# Patient Record
Sex: Male | Born: 1960 | Race: Black or African American | Hispanic: No | Marital: Married | Smoking: Former smoker
Health system: Southern US, Community
[De-identification: ages and names within clinical notes are randomized; demographics above are authoritative.]

## PROBLEM LIST (undated history)

## (undated) DIAGNOSIS — E559 Vitamin D deficiency, unspecified: Secondary | ICD-10-CM

## (undated) DIAGNOSIS — J45909 Unspecified asthma, uncomplicated: Secondary | ICD-10-CM

## (undated) DIAGNOSIS — I10 Essential (primary) hypertension: Secondary | ICD-10-CM

## (undated) DIAGNOSIS — R569 Unspecified convulsions: Secondary | ICD-10-CM

## (undated) DIAGNOSIS — G43909 Migraine, unspecified, not intractable, without status migrainosus: Secondary | ICD-10-CM

## (undated) DIAGNOSIS — C61 Malignant neoplasm of prostate: Secondary | ICD-10-CM

## (undated) DIAGNOSIS — M549 Dorsalgia, unspecified: Secondary | ICD-10-CM

## (undated) DIAGNOSIS — G4730 Sleep apnea, unspecified: Secondary | ICD-10-CM

## (undated) DIAGNOSIS — I209 Angina pectoris, unspecified: Secondary | ICD-10-CM

## (undated) DIAGNOSIS — K219 Gastro-esophageal reflux disease without esophagitis: Secondary | ICD-10-CM

## (undated) DIAGNOSIS — F4310 Post-traumatic stress disorder, unspecified: Secondary | ICD-10-CM

## (undated) DIAGNOSIS — E119 Type 2 diabetes mellitus without complications: Secondary | ICD-10-CM

## (undated) DIAGNOSIS — I219 Acute myocardial infarction, unspecified: Secondary | ICD-10-CM

## (undated) DIAGNOSIS — G8929 Other chronic pain: Secondary | ICD-10-CM

## (undated) HISTORY — PX: FRACTURE SURGERY: SHX138

## (undated) HISTORY — DX: Migraine, unspecified, not intractable, without status migrainosus: G43.909

## (undated) HISTORY — DX: Acute myocardial infarction, unspecified: I21.9

## (undated) HISTORY — DX: Vitamin D deficiency, unspecified: E55.9

## (undated) HISTORY — PX: HERNIA REPAIR: SHX51

---
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
## 1981-10-03 HISTORY — PX: TONGUE SURGERY: SHX810

## 2010-10-03 HISTORY — PX: LUMBAR DISC SURGERY: SHX700

## 2010-10-03 HISTORY — PX: BACK SURGERY: SHX140

## 2014-10-03 HISTORY — PX: FINGER FRACTURE SURGERY: SHX638

## 2015-10-04 DIAGNOSIS — C61 Malignant neoplasm of prostate: Secondary | ICD-10-CM

## 2015-10-04 HISTORY — DX: Malignant neoplasm of prostate: C61

## 2015-10-04 HISTORY — PX: PROSTATE BIOPSY: SHX241

## 2016-05-03 HISTORY — PX: PROSTATECTOMY: SHX69

## 2016-05-03 HISTORY — PX: ABDOMINAL HERNIA REPAIR: SHX539

## 2016-09-02 HISTORY — PX: CARDIAC CATHETERIZATION: SHX172

## 2017-01-13 ENCOUNTER — Emergency Department (HOSPITAL_COMMUNITY)
Admission: EM | Admit: 2017-01-13 | Discharge: 2017-01-13 | Disposition: A | Discharge status: Home / Self Care | Payer: Medicaid Other | Attending: Emergency Medicine | Admitting: Emergency Medicine

## 2017-01-13 ENCOUNTER — Encounter (HOSPITAL_COMMUNITY): Payer: Self-pay | Admitting: *Deleted

## 2017-01-13 DIAGNOSIS — Y929 Unspecified place or not applicable: Secondary | ICD-10-CM | POA: Insufficient documentation

## 2017-01-13 DIAGNOSIS — Y999 Unspecified external cause status: Secondary | ICD-10-CM | POA: Insufficient documentation

## 2017-01-13 DIAGNOSIS — Y939 Activity, unspecified: Secondary | ICD-10-CM | POA: Diagnosis not present

## 2017-01-13 DIAGNOSIS — M545 Low back pain, unspecified: Secondary | ICD-10-CM

## 2017-01-13 DIAGNOSIS — W1830XA Fall on same level, unspecified, initial encounter: Secondary | ICD-10-CM | POA: Insufficient documentation

## 2017-01-13 DIAGNOSIS — Z87891 Personal history of nicotine dependence: Secondary | ICD-10-CM | POA: Insufficient documentation

## 2017-01-13 DIAGNOSIS — Z859 Personal history of malignant neoplasm, unspecified: Secondary | ICD-10-CM | POA: Diagnosis not present

## 2017-01-13 HISTORY — DX: Other chronic pain: G89.29

## 2017-01-13 MED ORDER — KETOROLAC TROMETHAMINE 30 MG/ML IJ SOLN
30.0000 mg | Freq: Once | INTRAMUSCULAR | Status: AC
  Administered 2017-01-13: 30 mg via INTRAMUSCULAR
  Filled 2017-01-13: qty 1

## 2017-01-13 MED ORDER — KETOROLAC TROMETHAMINE 30 MG/ML IJ SOLN: 30.0000 mg | Freq: Once | INTRAMUSCULAR | Status: DC

## 2017-01-13 MED ORDER — MELOXICAM 7.5 MG PO TABS: 7.5000 mg | ORAL_TABLET | Freq: Every day | ORAL | 0 refills | Status: DC

## 2017-01-13 NOTE — ED Notes (Signed)
Papers reviewed with patient and he verbalizes understanding.UTA effectiveness of pain medication. toradol given shortly before DC

## 2017-01-13 NOTE — ED Provider Notes (Signed)
[facility] DEPT Provider Note   CSN: [id number] Arrival date & time: 01/13/17  2831  By signing my name below, I, Evan Mcdaniel, attest that this documentation has been prepared under the direction and in the presence of Evan Nestor, PA-C. Electronically Signed: Norris Mcdaniel , ED Scribe. 01/13/17. 3:35 PM.   History   Chief Complaint Chief Complaint  Patient presents with  . Back Pain    HPI Evan Mcdaniel is a 56 y.o. male with hx of chronic back pain, prostatectomy, HTN who presents to the Emergency Department complaining of constant, mild to moderate back pain with sudden onset x12 hours s/p mechanical fall. Pt states that he had a hard fall last night where he slipped and fell onto the L sided buttock. Upon falling, he reportedly hit his head but denies any headache at the moment or concern for head injury. He reports the pain being localized to the L sided back and buttock. Pt reports back pain, cough, sneezing. He has tried 10 mg oxycodone with no relief and states that the pain is exacerbated with forward flexion and extension. Pt denies numbness/tingling in the lower extremities, chest pain, nausea, vomiting and fever. Pt reports known allergy to Vicodin. Of note, pt is a smoker. Pt states that he took his blood pressure medication this morning and notes his blood pressure is elevated. Pt states that he does not want any imaging done. Pt's chart states that he is allergic to Naproxen but pt takes other NSAIDs and is able to tolerate them.   The history is provided by the patient. No language interpreter was used.    Past Medical History:  Diagnosis Date  . Cancer ([place])   . Chronic pain     There are no active problems to display for this patient.   Past Surgical History:  Procedure Laterality Date  . PROSTATECTOMY         Home Medications    Prior to Admission medications   Medication Sig Start Date End Date Taking? Authorizing Provider  meloxicam (MOBIC)  7.5 MG tablet Take 1 tablet (7.5 mg total) by mouth daily. 01/13/17   Bonnie Roig, PA-C    Family History No family history on file.  Social History Social History  Substance Use Topics  . Smoking status: Former Smoker    Quit date: 10/03/2013  . Smokeless tobacco: Never Used  . Alcohol use No     Allergies   Naproxen and Vicodin [hydrocodone-acetaminophen]   Review of Systems Review of Systems  Constitutional: Negative for fever.  HENT: Positive for sneezing.   Respiratory: Positive for cough.   Cardiovascular: Negative for chest pain.  Gastrointestinal: Negative for nausea and vomiting.  Musculoskeletal: Positive for back pain.  Neurological: Negative for numbness.     Physical Exam Updated Vital Signs BP (!) 162/107 (BP Location: Right Arm)   Pulse 69   Temp 97.2 F (36.2 C) (Oral)   Resp 16   Ht 5\' 11"  (1.803 m)   Wt 93.5 kg   SpO2 99%   BMI 28.76 kg/m   Physical Exam  Constitutional: He appears well-developed and well-nourished.  HENT:  Head: Normocephalic and atraumatic.  Eyes: Conjunctivae are normal.  Neck: Neck supple.  Cardiovascular: Normal rate and regular rhythm.   No murmur heard. Pulmonary/Chest: Effort normal and breath sounds normal. No respiratory distress.  Abdominal: Soft. There is no tenderness.  Musculoskeletal: He exhibits no edema or deformity.       Lumbar back: He exhibits tenderness.  L sided paraspinal tenderness in the upper lumbar area with no bruising. Pt has painful ROM with forward flexion and extension but he is able to walk normally.   Neurological: He is alert. No sensory deficit. He exhibits normal muscle tone. Coordination normal.  Skin: Skin is warm and dry.  Psychiatric: He has a normal mood and affect.  Nursing note and vitals reviewed.    ED Treatments / Results   DIAGNOSTIC STUDIES: Oxygen Saturation is 99% on RA, normal by my interpretation.   COORDINATION OF CARE: 3:35 PM-Discussed next steps with pt.  Pt verbalized understanding and is agreeable with the plan.    Labs (all labs ordered are listed, but only abnormal results are displayed) Labs Reviewed - No data to display  EKG  EKG Interpretation None       Radiology No results found.  Procedures Procedures (including critical care time)  Medications Ordered in ED Medications  ketorolac (TORADOL) 30 MG/ML injection 30 mg (30 mg Intramuscular Given 01/13/17 1042)     Initial Impression / Assessment and Plan / ED Course  I have reviewed the triage vital signs and the nursing notes.  Pertinent labs & imaging results that were available during my care of the patient were reviewed by me and considered in my medical decision making (see chart for details).     History and symptoms concerning for muscle strain of lower back. Patient states he has "broken my back" before and this feelings nothing like that. Able to ambulate normally. Denies any bladder incontinence out of the ordinary for him. He refuses imaging at this time. He has no concern for head injury. Neurological exam is normal. States he would like anti-inflammatory to help with the pain and declined muscle relaxer. Inquired about narcotic pain medication but we declined. Because patient states he is able to tolerate anti-inflammatories other than naproxen, we'll start him on mobic once daily and encouraged to follow up with PCP.   Final Clinical Impressions(s) / ED Diagnoses   Final diagnoses:  Left-sided low back pain without sciatica, unspecified chronicity    New Prescriptions Discharge Medication List as of 01/13/2017 10:28 AM    START taking these medications   Details  meloxicam (MOBIC) 7.5 MG tablet Take 1 tablet (7.5 mg total) by mouth daily., Starting Fri 01/13/2017, Print       I personally performed the services described in this documentation, which was scribed in my presence. The recorded information has been reviewed and is accurate.     Evan Heady, PA-C 01/13/17 [place], MD 01/14/17 [phone]

## 2017-01-13 NOTE — ED Triage Notes (Signed)
Pt reports falling last night and hit head on L side, no facial injury noted, denies LOC, denies neck pain, pt ambulatory, MAE, pt denies taking blood thinners, pt reports mid upper back pain, denies bowel & bladder incontinence, A&O x4, pt takes radiation every other week for prostate cancer, recent prostatectomy in Dec 2017, denies fever & chills

## 2017-01-13 NOTE — Discharge Instructions (Signed)
Begin taking Mobic once daily for pain and inflammation. Follow up with PCP in 2-3 days if symptoms do not improve. Return to ED for increased pain, trouble walking, numbness, weakness, fever or additional injury.

## 2017-01-15 ENCOUNTER — Emergency Department (HOSPITAL_COMMUNITY): Payer: Medicaid Other

## 2017-01-15 ENCOUNTER — Emergency Department (HOSPITAL_COMMUNITY)
Admission: EM | Admit: 2017-01-15 | Discharge: 2017-01-15 | Disposition: A | Discharge status: Home / Self Care | Payer: Medicaid Other | Attending: Emergency Medicine | Admitting: Emergency Medicine

## 2017-01-15 DIAGNOSIS — Z7982 Long term (current) use of aspirin: Secondary | ICD-10-CM | POA: Diagnosis not present

## 2017-01-15 DIAGNOSIS — Z87891 Personal history of nicotine dependence: Secondary | ICD-10-CM | POA: Diagnosis not present

## 2017-01-15 DIAGNOSIS — Z794 Long term (current) use of insulin: Secondary | ICD-10-CM | POA: Insufficient documentation

## 2017-01-15 DIAGNOSIS — Z79899 Other long term (current) drug therapy: Secondary | ICD-10-CM | POA: Insufficient documentation

## 2017-01-15 DIAGNOSIS — R079 Chest pain, unspecified: Secondary | ICD-10-CM | POA: Insufficient documentation

## 2017-01-15 DIAGNOSIS — Z9114 Patient's other noncompliance with medication regimen: Secondary | ICD-10-CM | POA: Insufficient documentation

## 2017-01-15 DIAGNOSIS — Z859 Personal history of malignant neoplasm, unspecified: Secondary | ICD-10-CM | POA: Diagnosis not present

## 2017-01-15 LAB — CBC
HCT: 42.6 % (ref 39.0–52.0)
HEMOGLOBIN: 13.8 g/dL (ref 13.0–17.0)
MCH: 28.2 pg (ref 26.0–34.0)
MCHC: 32.4 g/dL (ref 30.0–36.0)
MCV: 86.9 fL (ref 78.0–100.0)
Platelets: 181 10*3/uL (ref 150–400)
RBC: 4.9 MIL/uL (ref 4.22–5.81)
RDW: 14.3 % (ref 11.5–15.5)
WBC: 5.5 10*3/uL (ref 4.0–10.5)

## 2017-01-15 LAB — BASIC METABOLIC PANEL
ANION GAP: 14 (ref 5–15)
BUN: 10 mg/dL (ref 6–20)
CHLORIDE: 99 mmol/L — AB (ref 101–111)
CO2: 24 mmol/L (ref 22–32)
Calcium: 9.8 mg/dL (ref 8.9–10.3)
Creatinine, Ser: 0.85 mg/dL (ref 0.61–1.24)
GFR calc Af Amer: >60 mL/min (ref >60)
GFR calc non Af Amer: >60 mL/min (ref >60)
Glucose, Bld: 169 mg/dL — ABNORMAL HIGH (ref 65–99)
POTASSIUM: 4.1 mmol/L (ref 3.5–5.1)
SODIUM: 137 mmol/L (ref 135–145)

## 2017-01-15 LAB — I-STAT TROPONIN, ED: Troponin i, poc: 0 ng/mL (ref 0.00–0.08)

## 2017-01-15 LAB — PHENYTOIN LEVEL, TOTAL: Phenytoin Lvl: <2.5 ug/mL — ABNORMAL LOW (ref 10.0–20.0)

## 2017-01-15 MED ORDER — AMLODIPINE BESYLATE 10 MG PO TABS: 10.0000 mg | ORAL_TABLET | Freq: Every day | ORAL | 0 refills | Status: AC

## 2017-01-15 MED ORDER — LORAZEPAM 1 MG PO TABS
1.0000 mg | ORAL_TABLET | Freq: Once | ORAL | Status: AC
  Administered 2017-01-15: 1 mg via ORAL
  Filled 2017-01-15: qty 1

## 2017-01-15 MED ORDER — VENLAFAXINE HCL 75 MG PO TABS: 225.0000 mg | ORAL_TABLET | Freq: Every day | ORAL | 0 refills | Status: AC

## 2017-01-15 MED ORDER — METFORMIN HCL 1000 MG PO TABS: 1000.0000 mg | ORAL_TABLET | Freq: Two times a day (BID) | ORAL | 0 refills | Status: AC

## 2017-01-15 MED ORDER — HYDROCHLOROTHIAZIDE 25 MG PO TABS: 25.0000 mg | ORAL_TABLET | Freq: Every day | ORAL | 0 refills | Status: DC

## 2017-01-15 MED ORDER — ESCITALOPRAM OXALATE 10 MG PO TABS: 10.0000 mg | ORAL_TABLET | Freq: Every day | ORAL | Status: DC

## 2017-01-15 MED ORDER — LISINOPRIL 40 MG PO TABS: 40.0000 mg | ORAL_TABLET | Freq: Every day | ORAL | 0 refills | Status: DC

## 2017-01-15 MED ORDER — ACETAMINOPHEN 325 MG PO TABS
650.0000 mg | ORAL_TABLET | Freq: Once | ORAL | Status: AC
  Administered 2017-01-15: 650 mg via ORAL
  Filled 2017-01-15: qty 2

## 2017-01-15 MED ORDER — VENLAFAXINE HCL 75 MG PO TABS
75.0000 mg | ORAL_TABLET | Freq: Three times a day (TID) | ORAL | Status: DC
  Administered 2017-01-15: 75 mg via ORAL
  Filled 2017-01-15: qty 1

## 2017-01-15 NOTE — ED Notes (Signed)
Called SW but patient left before SW answered. Patient chose not to wait. Given bus pass. Patient leaving now.

## 2017-01-15 NOTE — ED Provider Notes (Signed)
[facility] DEPT Provider Note   CSN: [id number] Arrival date & time: 01/15/17  1102     History   Chief Complaint Chief Complaint  Patient presents with  . Chest Pain    HPI Evan Mcdaniel is a 56 y.o. male.  HPI This is a 56 year old man history of prostate cancer and chronic pain who presents today complaining of chest pain. He states that the chest pain began earlier today at approximately 9 AM. He complains of some associated sweating. He states that he had similar symptoms with an MI. He states that he has been out of his medications after losing them when he changed the hotel rooms. He is on narcotics and benzodiazepines ordered by a pain clinic. He describes pain as sharp and anterior worsening some with position change. He has not taken any medications for this but received nitroglycerin and route without any change. [facility] of [facility] reveal no evidence of cardiac history with a normal cardiac catheterization Past Medical History:  Diagnosis Date  . Cancer ([place])   . Chronic pain     There are no active problems to display for this patient.   Past Surgical History:  Procedure Laterality Date  . PROSTATECTOMY         Home Medications    Prior to Admission medications   Medication Sig Start Date End Date Taking? Authorizing Provider  albuterol (PROVENTIL HFA;VENTOLIN HFA) 108 (90 Base) MCG/ACT inhaler Inhale 2 puffs into the lungs every 6 (six) hours as needed for wheezing or shortness of breath.   Yes Historical Provider, MD  ALPRAZolam Duanne Moron) 1 MG tablet Take 1 mg by mouth 3 (three) times daily. 12/29/16  Yes Historical Provider, MD  amLODipine (NORVASC) 10 MG tablet Take 10 mg by mouth daily.   Yes Historical Provider, MD  aspirin 325 MG tablet Take 325 mg by mouth daily.   Yes Historical Provider, MD  escitalopram (LEXAPRO) 10 MG tablet Take 10 mg by mouth daily.   Yes Historical Provider, MD  famotidine (PEPCID) 10 MG tablet Take  10 mg by mouth daily. 12/01/16  Yes Historical Provider, MD  hydrochlorothiazide (HYDRODIURIL) 25 MG tablet Take 25 mg by mouth daily. 01/02/17  Yes Historical Provider, MD  insulin aspart (NOVOLOG) 100 UNIT/ML injection Inject 20 Units into the skin 2 (two) times daily.   Yes Historical Provider, MD  LANTUS SOLOSTAR 100 UNIT/ML Solostar Pen Inject 15 Units into the skin at bedtime. 12/01/16  Yes Historical Provider, MD  lisinopril (PRINIVIL,ZESTRIL) 40 MG tablet Take 40 mg by mouth daily. 01/02/17  Yes Historical Provider, MD  metFORMIN (GLUCOPHAGE) 1000 MG tablet Take 1,000 mg by mouth 2 (two) times daily with a meal.   Yes Historical Provider, MD  metoprolol succinate (TOPROL-XL) 100 MG 24 hr tablet Take 100 mg by mouth daily. Take with or immediately following a meal.   Yes Historical Provider, MD  nitroGLYCERIN (NITROSTAT) 0.4 MG SL tablet Place 0.4 mg under the tongue every 5 (five) minutes as needed for chest pain.   Yes Historical Provider, MD  oxyCODONE (OXYCONTIN) 10 mg 12 hr tablet Take 10 mg by mouth every 12 (twelve) hours.   Yes Historical Provider, MD  oxyCODONE (ROXICODONE) 15 MG immediate release tablet Take 15 mg by mouth 4 (four) times daily as needed. 01/11/17  Yes Historical Provider, MD  phenytoin (DILANTIN) 100 MG ER capsule Take 100 mg by mouth 3 (three) times daily.   Yes Historical Provider, MD  simvastatin (  ZOCOR) 10 MG tablet Take 10 mg by mouth daily at 6 PM.   Yes Historical Provider, MD  sitaGLIPtin (JANUVIA) 50 MG tablet Take 50 mg by mouth daily.   Yes Historical Provider, MD  venlafaxine (EFFEXOR) 75 MG tablet Take 225 mg by mouth daily. 10/14/16  Yes Historical Provider, MD  Vitamin D, Ergocalciferol, (DRISDOL) 50000 units CAPS capsule Take 50,000 Units by mouth every Monday.   Yes Historical Provider, MD  meloxicam (MOBIC) 7.5 MG tablet Take 1 tablet (7.5 mg total) by mouth daily. Patient not taking: Reported on 01/15/2017 01/13/17   [name], PA-C    Family History No  family history on file.  Social History Social History  Substance Use Topics  . Smoking status: Former Smoker    Quit date: 10/03/2013  . Smokeless tobacco: Never Used  . Alcohol use No     Allergies   Naproxen and Vicodin [hydrocodone-acetaminophen]   Review of Systems Review of Systems  All other systems reviewed and are negative.    Physical Exam Updated Vital Signs BP (!) 158/101 (BP Location: Right Arm)   Pulse 64   Temp 98.1 F (36.7 C) (Oral)   Resp 18   Ht 5\' 11"  (1.803 m)   Wt 93.4 kg   SpO2 100%   BMI 28.73 kg/m   Physical Exam  Constitutional: He is oriented to person, place, and time. He appears well-developed and well-nourished.  HENT:  Head: Normocephalic and atraumatic.  Right Ear: External ear normal.  Left Ear: External ear normal.  Eyes: Conjunctivae and EOM are normal. Pupils are equal, round, and reactive to light.  Neck: Normal range of motion. Neck supple.  Cardiovascular: Normal rate, regular rhythm and normal heart sounds.   Pulmonary/Chest: Effort normal and breath sounds normal.  Abdominal: Soft. He exhibits no distension.  Musculoskeletal: Normal range of motion.  Neurological: He is alert and oriented to person, place, and time.  Skin: Skin is warm and dry. Capillary refill takes less than 2 seconds.  Psychiatric: He has a normal mood and affect.  Nursing note and vitals reviewed.    ED Treatments / Results  Labs (all labs ordered are listed, but only abnormal results are displayed) Labs Reviewed  BASIC METABOLIC PANEL - Abnormal; Notable for the following:       Result Value   Chloride 99 (*)    Glucose, Bld 169 (*)    All other components within normal limits  PHENYTOIN LEVEL, TOTAL - Abnormal; Notable for the following:    Phenytoin Lvl <2.5 (*)    All other components within normal limits  CBC  I-STAT TROPOININ, ED    EKG  EKG Interpretation  Date/Time:  Sunday January 15 2017 11:15:12 EDT Ventricular Rate:  69 PR  Interval:    QRS Duration: 84 QT Interval:  402 QTC Calculation: 431 R Axis:   24 Text Interpretation:  Sinus rhythm Borderline low voltage, extremity leads Confirmed by Omya Winfield MD, Andee Poles ([id number]) on 01/15/2017 11:23:57 AM Also confirmed by Murrell Elizondo MD, Andee Poles [phone]), editor Drema Pry [phone])  on 01/15/2017 12:01:20 PM       Radiology Dg Chest 2 View  Result Date: 01/15/2017 CLINICAL DATA:  Chest pain. EXAM: CHEST  2 VIEW COMPARISON:  None. FINDINGS: Midline trachea. Normal heart size. Mildly tortuous thoracic aorta with atherosclerosis in the transverse segment. No pleural effusion or pneumothorax. Clear lungs. IMPRESSION: No acute cardiopulmonary disease. Aortic atherosclerosis. Electronically Signed   By: Abigail Miyamoto M.D.   On:  01/15/2017 13:23    Procedures Procedures (including critical care time)  Medications Ordered in ED Medications  venlafaxine [facility]) tablet 75 mg (not administered)  acetaminophen (TYLENOL) tablet 650 mg (not administered)  LORazepam (ATIVAN) tablet 1 mg (1 mg Oral Given 01/15/17 1156)     Initial Impression / Assessment and Plan / ED Course  I have reviewed the triage vital signs and the nursing notes.  Pertinent labs & imaging results that were available during my care of the patient were reviewed by me and considered in my medical decision making (see chart for details).     Patient with normal workup here. He requested his Effexor was given a dose of his Effexor here. He has no further complaints of chest pain. Chest x-Torri Langston, EKG, and troponin normal. We discussed his medications and I will refill his Effexor and blood pressure medications. He will follow-up with his pain medicine doctors.  Final Clinical Impressions(s) / ED Diagnoses   Final diagnoses:  Chest pain, unspecified type  Noncompliance with medications    New Prescriptions New Prescriptions   No medications on file     Pattricia Boss, MD 01/15/17 1425

## 2017-01-15 NOTE — ED Triage Notes (Signed)
Patient comes in per [facility] with chest pain. Started at [place] upon waking. Patient c/o sob. Lung sounds clear. Cardiac hx. EMS gave 324 mg aspirin. Patient took nitro x2 without relief and EMS gave nitro x1 without relief. IV access 18 in LAC.
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
## 2017-03-28 ENCOUNTER — Other Ambulatory Visit: Payer: Self-pay

## 2017-03-28 ENCOUNTER — Emergency Department (HOSPITAL_COMMUNITY): Payer: Medicaid Other

## 2017-03-28 ENCOUNTER — Observation Stay (HOSPITAL_COMMUNITY)
Admission: EM | Admit: 2017-03-28 | Discharge: 2017-03-30 | Disposition: A | Discharge status: Home / Self Care | Payer: Medicaid Other | Attending: Internal Medicine | Admitting: Internal Medicine

## 2017-03-28 ENCOUNTER — Encounter (HOSPITAL_COMMUNITY): Payer: Self-pay | Admitting: *Deleted

## 2017-03-28 DIAGNOSIS — Z7982 Long term (current) use of aspirin: Secondary | ICD-10-CM | POA: Diagnosis not present

## 2017-03-28 DIAGNOSIS — R0602 Shortness of breath: Secondary | ICD-10-CM | POA: Insufficient documentation

## 2017-03-28 DIAGNOSIS — E119 Type 2 diabetes mellitus without complications: Secondary | ICD-10-CM | POA: Diagnosis not present

## 2017-03-28 DIAGNOSIS — C799 Secondary malignant neoplasm of unspecified site: Secondary | ICD-10-CM

## 2017-03-28 DIAGNOSIS — F17210 Nicotine dependence, cigarettes, uncomplicated: Secondary | ICD-10-CM | POA: Insufficient documentation

## 2017-03-28 DIAGNOSIS — Z794 Long term (current) use of insulin: Secondary | ICD-10-CM | POA: Insufficient documentation

## 2017-03-28 DIAGNOSIS — Z8546 Personal history of malignant neoplasm of prostate: Secondary | ICD-10-CM | POA: Diagnosis not present

## 2017-03-28 DIAGNOSIS — R0789 Other chest pain: Secondary | ICD-10-CM | POA: Diagnosis not present

## 2017-03-28 DIAGNOSIS — R200 Anesthesia of skin: Secondary | ICD-10-CM | POA: Diagnosis not present

## 2017-03-28 DIAGNOSIS — R079 Chest pain, unspecified: Secondary | ICD-10-CM

## 2017-03-28 DIAGNOSIS — G8929 Other chronic pain: Secondary | ICD-10-CM | POA: Diagnosis present

## 2017-03-28 DIAGNOSIS — Z79899 Other long term (current) drug therapy: Secondary | ICD-10-CM | POA: Diagnosis not present

## 2017-03-28 DIAGNOSIS — I10 Essential (primary) hypertension: Secondary | ICD-10-CM | POA: Diagnosis present

## 2017-03-28 DIAGNOSIS — F1290 Cannabis use, unspecified, uncomplicated: Secondary | ICD-10-CM | POA: Diagnosis present

## 2017-03-28 DIAGNOSIS — I639 Cerebral infarction, unspecified: Secondary | ICD-10-CM

## 2017-03-28 DIAGNOSIS — C61 Malignant neoplasm of prostate: Secondary | ICD-10-CM | POA: Diagnosis present

## 2017-03-28 DIAGNOSIS — R531 Weakness: Secondary | ICD-10-CM | POA: Diagnosis not present

## 2017-03-28 HISTORY — DX: Essential (primary) hypertension: I10

## 2017-03-28 LAB — COMPREHENSIVE METABOLIC PANEL
ALK PHOS: 53 U/L (ref 38–126)
ALT: 16 U/L — ABNORMAL LOW (ref 17–63)
ANION GAP: 11 (ref 5–15)
AST: 19 U/L (ref 15–41)
Albumin: 4.1 g/dL (ref 3.5–5.0)
BILIRUBIN TOTAL: 0.6 mg/dL (ref 0.3–1.2)
BUN: 10 mg/dL (ref 6–20)
CALCIUM: 9.5 mg/dL (ref 8.9–10.3)
CO2: 24 mmol/L (ref 22–32)
Chloride: 104 mmol/L (ref 101–111)
Creatinine, Ser: 0.89 mg/dL (ref 0.61–1.24)
GFR calc non Af Amer: >60 mL/min (ref >60)
GLUCOSE: 115 mg/dL — AB (ref 65–99)
Potassium: 3.3 mmol/L — ABNORMAL LOW (ref 3.5–5.1)
Sodium: 139 mmol/L (ref 135–145)
TOTAL PROTEIN: 7 g/dL (ref 6.5–8.1)

## 2017-03-28 LAB — URINALYSIS, ROUTINE W REFLEX MICROSCOPIC
BILIRUBIN URINE: NEGATIVE
Glucose, UA: NEGATIVE mg/dL
HGB URINE DIPSTICK: NEGATIVE
KETONES UR: NEGATIVE mg/dL
Leukocytes, UA: NEGATIVE
Nitrite: NEGATIVE
PROTEIN: NEGATIVE mg/dL
Specific Gravity, Urine: 1.005 (ref 1.005–1.030)
pH: 5 (ref 5.0–8.0)

## 2017-03-28 LAB — DIFFERENTIAL
BASOS PCT: 0 %
Basophils Absolute: 0 10*3/uL (ref 0.0–0.1)
EOS ABS: 0 10*3/uL (ref 0.0–0.7)
Eosinophils Relative: 1 %
LYMPHS ABS: 2.6 10*3/uL (ref 0.7–4.0)
Lymphocytes Relative: 34 %
MONO ABS: 0.6 10*3/uL (ref 0.1–1.0)
MONOS PCT: 8 %
Neutro Abs: 4.3 10*3/uL (ref 1.7–7.7)
Neutrophils Relative %: 57 %

## 2017-03-28 LAB — RAPID URINE DRUG SCREEN, HOSP PERFORMED
Amphetamines: NOT DETECTED
BARBITURATES: NOT DETECTED
BENZODIAZEPINES: NOT DETECTED
Cocaine: NOT DETECTED
Opiates: POSITIVE — AB
TETRAHYDROCANNABINOL: POSITIVE — AB

## 2017-03-28 LAB — I-STAT CHEM 8, ED
BUN: 12 mg/dL (ref 6–20)
CALCIUM ION: 1.2 mmol/L (ref 1.15–1.40)
CREATININE: 0.8 mg/dL (ref 0.61–1.24)
Chloride: 100 mmol/L — ABNORMAL LOW (ref 101–111)
Glucose, Bld: 110 mg/dL — ABNORMAL HIGH (ref 65–99)
HEMATOCRIT: 42 % (ref 39.0–52.0)
HEMOGLOBIN: 14.3 g/dL (ref 13.0–17.0)
Potassium: 3.3 mmol/L — ABNORMAL LOW (ref 3.5–5.1)
SODIUM: 140 mmol/L (ref 135–145)
TCO2: 25 mmol/L (ref 0–100)

## 2017-03-28 LAB — TROPONIN I

## 2017-03-28 LAB — CBC
HCT: 39.3 % (ref 39.0–52.0)
Hemoglobin: 12.9 g/dL — ABNORMAL LOW (ref 13.0–17.0)
MCH: 28.5 pg (ref 26.0–34.0)
MCHC: 32.8 g/dL (ref 30.0–36.0)
MCV: 86.9 fL (ref 78.0–100.0)
PLATELETS: 188 10*3/uL (ref 150–400)
RBC: 4.52 MIL/uL (ref 4.22–5.81)
RDW: 14.3 % (ref 11.5–15.5)
WBC: 7.5 10*3/uL (ref 4.0–10.5)

## 2017-03-28 LAB — D-DIMER, QUANTITATIVE (NOT AT ARMC)

## 2017-03-28 LAB — SEDIMENTATION RATE: SED RATE: 4 mm/h (ref 0–16)

## 2017-03-28 LAB — BRAIN NATRIURETIC PEPTIDE: B NATRIURETIC PEPTIDE 5: 12.7 pg/mL (ref 0.0–100.0)

## 2017-03-28 LAB — ETHANOL: Alcohol, Ethyl (B): <5 mg/dL (ref <5)

## 2017-03-28 LAB — C-REACTIVE PROTEIN: CRP: <0.8 mg/dL (ref <1.0)

## 2017-03-28 LAB — GLUCOSE, CAPILLARY: GLUCOSE-CAPILLARY: 94 mg/dL (ref 65–99)

## 2017-03-28 LAB — I-STAT TROPONIN, ED: Troponin i, poc: 0 ng/mL (ref 0.00–0.08)

## 2017-03-28 LAB — CBG MONITORING, ED: Glucose-Capillary: 110 mg/dL — ABNORMAL HIGH (ref 65–99)

## 2017-03-28 LAB — PROTIME-INR
INR: 1.04
Prothrombin Time: 13.6 seconds (ref 11.4–15.2)

## 2017-03-28 LAB — APTT: aPTT: 27 seconds (ref 24–36)

## 2017-03-28 MED ORDER — NITROGLYCERIN 2 % TD OINT
0.5000 [in_us] | TOPICAL_OINTMENT | Freq: Four times a day (QID) | TRANSDERMAL | Status: DC
  Administered 2017-03-29 – 2017-03-30 (×6): 0.5 [in_us] via TOPICAL
  Filled 2017-03-28 (×29): qty 30

## 2017-03-28 MED ORDER — PHENYTOIN SODIUM EXTENDED 100 MG PO CAPS
100.0000 mg | ORAL_CAPSULE | Freq: Two times a day (BID) | ORAL | Status: DC
  Administered 2017-03-28 – 2017-03-30 (×5): 100 mg via ORAL
  Filled 2017-03-28 (×5): qty 1

## 2017-03-28 MED ORDER — AMLODIPINE BESYLATE 10 MG PO TABS
10.0000 mg | ORAL_TABLET | Freq: Every day | ORAL | Status: DC
  Administered 2017-03-29 – 2017-03-30 (×2): 10 mg via ORAL
  Filled 2017-03-28 (×2): qty 1

## 2017-03-28 MED ORDER — MORPHINE SULFATE (PF) 10 MG/ML IV SOLN
10.0000 mg | Freq: Once | INTRAVENOUS | Status: AC
  Administered 2017-03-28: 10 mg via INTRAVENOUS
  Filled 2017-03-28: qty 1

## 2017-03-28 MED ORDER — ESCITALOPRAM OXALATE 10 MG PO TABS
10.0000 mg | ORAL_TABLET | Freq: Every day | ORAL | Status: DC
  Administered 2017-03-29 – 2017-03-30 (×2): 10 mg via ORAL
  Filled 2017-03-28 (×2): qty 1

## 2017-03-28 MED ORDER — INSULIN GLARGINE 100 UNIT/ML SOLOSTAR PEN: 20.0000 [IU] | PEN_INJECTOR | Freq: Two times a day (BID) | SUBCUTANEOUS | Status: DC

## 2017-03-28 MED ORDER — LISINOPRIL-HYDROCHLOROTHIAZIDE 20-12.5 MG PO TABS: 1.0000 | ORAL_TABLET | Freq: Two times a day (BID) | ORAL | Status: DC

## 2017-03-28 MED ORDER — INSULIN GLARGINE 100 UNIT/ML ~~LOC~~ SOLN
20.0000 [IU] | Freq: Two times a day (BID) | SUBCUTANEOUS | Status: DC
  Administered 2017-03-29 – 2017-03-30 (×3): 20 [IU] via SUBCUTANEOUS
  Filled 2017-03-28 (×6): qty 0.2

## 2017-03-28 MED ORDER — NEBIVOLOL HCL 5 MG PO TABS
10.0000 mg | ORAL_TABLET | Freq: Every day | ORAL | Status: DC
  Administered 2017-03-29 – 2017-03-30 (×2): 10 mg via ORAL
  Filled 2017-03-28 (×2): qty 2

## 2017-03-28 MED ORDER — FAMOTIDINE 20 MG PO TABS
10.0000 mg | ORAL_TABLET | Freq: Every day | ORAL | Status: DC
  Administered 2017-03-29 – 2017-03-30 (×2): 10 mg via ORAL
  Filled 2017-03-28 (×2): qty 1

## 2017-03-28 MED ORDER — GI COCKTAIL ~~LOC~~: 30.0000 mL | Freq: Four times a day (QID) | ORAL | Status: DC | PRN

## 2017-03-28 MED ORDER — NITROGLYCERIN 0.4 MG SL SUBL: 0.4000 mg | SUBLINGUAL_TABLET | SUBLINGUAL | Status: DC | PRN

## 2017-03-28 MED ORDER — OXYCODONE HCL ER 10 MG PO T12A
10.0000 mg | EXTENDED_RELEASE_TABLET | Freq: Three times a day (TID) | ORAL | Status: DC
  Administered 2017-03-28 – 2017-03-30 (×7): 10 mg via ORAL
  Filled 2017-03-28 (×8): qty 1

## 2017-03-28 MED ORDER — ZOLPIDEM TARTRATE 5 MG PO TABS: 5.0000 mg | ORAL_TABLET | Freq: Every evening | ORAL | Status: DC | PRN

## 2017-03-28 MED ORDER — METOPROLOL SUCCINATE ER 100 MG PO TB24
100.0000 mg | ORAL_TABLET | Freq: Every day | ORAL | Status: DC
  Administered 2017-03-28 – 2017-03-29 (×2): 100 mg via ORAL
  Filled 2017-03-28 (×2): qty 1

## 2017-03-28 MED ORDER — SIMVASTATIN 10 MG PO TABS
10.0000 mg | ORAL_TABLET | Freq: Every day | ORAL | Status: DC
  Administered 2017-03-28 – 2017-03-30 (×3): 10 mg via ORAL
  Filled 2017-03-28 (×3): qty 1

## 2017-03-28 MED ORDER — IOPAMIDOL (ISOVUE-370) INJECTION 76%
INTRAVENOUS | Status: AC
  Administered 2017-03-28: 50 mL via INTRAVENOUS
  Filled 2017-03-28: qty 50

## 2017-03-28 MED ORDER — INSULIN ASPART 100 UNIT/ML ~~LOC~~ SOLN: 0.0000 [IU] | Freq: Every day | SUBCUTANEOUS | Status: DC

## 2017-03-28 MED ORDER — OXYCODONE HCL 5 MG PO TABS
15.0000 mg | ORAL_TABLET | Freq: Two times a day (BID) | ORAL | Status: DC | PRN
  Administered 2017-03-28 – 2017-03-29 (×2): 15 mg via ORAL
  Filled 2017-03-28 (×2): qty 3

## 2017-03-28 MED ORDER — ASPIRIN EC 81 MG PO TBEC: 81.0000 mg | DELAYED_RELEASE_TABLET | Freq: Every day | ORAL | Status: DC

## 2017-03-28 MED ORDER — NICOTINE 14 MG/24HR TD PT24
14.0000 mg | MEDICATED_PATCH | Freq: Every day | TRANSDERMAL | Status: DC
  Administered 2017-03-28 – 2017-03-30 (×3): 14 mg via TRANSDERMAL
  Filled 2017-03-28 (×3): qty 1

## 2017-03-28 MED ORDER — SODIUM CHLORIDE 0.9 % IV SOLN
INTRAVENOUS | Status: DC
  Administered 2017-03-28 – 2017-03-29 (×2): via INTRAVENOUS

## 2017-03-28 MED ORDER — NITROGLYCERIN 0.4 MG SL SUBL
0.4000 mg | SUBLINGUAL_TABLET | SUBLINGUAL | Status: DC | PRN
  Administered 2017-03-28 (×2): 0.4 mg via SUBLINGUAL
  Filled 2017-03-28: qty 1

## 2017-03-28 MED ORDER — ENOXAPARIN SODIUM 40 MG/0.4ML ~~LOC~~ SOLN
40.0000 mg | SUBCUTANEOUS | Status: DC
  Administered 2017-03-28 – 2017-03-29 (×2): 40 mg via SUBCUTANEOUS
  Filled 2017-03-28 (×3): qty 0.4

## 2017-03-28 MED ORDER — ACETAMINOPHEN 325 MG PO TABS
650.0000 mg | ORAL_TABLET | ORAL | Status: DC | PRN
  Administered 2017-03-29 – 2017-03-30 (×2): 650 mg via ORAL
  Filled 2017-03-28 (×2): qty 2

## 2017-03-28 MED ORDER — INSULIN ASPART 100 UNIT/ML ~~LOC~~ SOLN
0.0000 [IU] | Freq: Three times a day (TID) | SUBCUTANEOUS | Status: DC
  Administered 2017-03-29 – 2017-03-30 (×2): 1 [IU] via SUBCUTANEOUS

## 2017-03-28 MED ORDER — VENLAFAXINE HCL 75 MG PO TABS
225.0000 mg | ORAL_TABLET | Freq: Every day | ORAL | Status: DC
  Administered 2017-03-29: 225 mg via ORAL
  Filled 2017-03-28 (×2): qty 3

## 2017-03-28 MED ORDER — BUSPIRONE HCL 5 MG PO TABS
7.5000 mg | ORAL_TABLET | Freq: Three times a day (TID) | ORAL | Status: DC
  Administered 2017-03-28 – 2017-03-30 (×7): 7.5 mg via ORAL
  Filled 2017-03-28 (×3): qty 2
  Filled 2017-03-28: qty 1
  Filled 2017-03-28 (×3): qty 2

## 2017-03-28 MED ORDER — LINAGLIPTIN 5 MG PO TABS
5.0000 mg | ORAL_TABLET | Freq: Every day | ORAL | Status: DC
  Administered 2017-03-29 – 2017-03-30 (×2): 5 mg via ORAL
  Filled 2017-03-28 (×2): qty 1

## 2017-03-28 MED ORDER — LORAZEPAM 2 MG/ML IJ SOLN
INTRAMUSCULAR | Status: AC
Start: 2017-03-28 — End: 2017-03-29
  Filled 2017-03-28: qty 1

## 2017-03-28 MED ORDER — ASPIRIN EC 325 MG PO TBEC
325.0000 mg | DELAYED_RELEASE_TABLET | Freq: Every day | ORAL | Status: DC
  Administered 2017-03-29 – 2017-03-30 (×2): 325 mg via ORAL
  Filled 2017-03-28 (×2): qty 1

## 2017-03-28 MED ORDER — ALBUTEROL SULFATE (2.5 MG/3ML) 0.083% IN NEBU: 3.0000 mL | INHALATION_SOLUTION | Freq: Four times a day (QID) | RESPIRATORY_TRACT | Status: DC | PRN

## 2017-03-28 MED ORDER — ALPRAZOLAM 0.5 MG PO TABS
1.0000 mg | ORAL_TABLET | Freq: Three times a day (TID) | ORAL | Status: DC
  Administered 2017-03-28 – 2017-03-30 (×7): 1 mg via ORAL
  Filled 2017-03-28 (×7): qty 2

## 2017-03-28 MED ORDER — CLONIDINE HCL 0.1 MG PO TABS
0.1000 mg | ORAL_TABLET | Freq: Two times a day (BID) | ORAL | Status: DC
  Administered 2017-03-28 – 2017-03-30 (×5): 0.1 mg via ORAL
  Filled 2017-03-28 (×5): qty 1

## 2017-03-28 MED ORDER — ONDANSETRON HCL 4 MG/2ML IJ SOLN
4.0000 mg | Freq: Four times a day (QID) | INTRAMUSCULAR | Status: DC | PRN
  Administered 2017-03-28: 4 mg via INTRAVENOUS
  Filled 2017-03-28: qty 2

## 2017-03-28 MED ORDER — LORAZEPAM 2 MG/ML IJ SOLN
2.0000 mg | Freq: Once | INTRAMUSCULAR | Status: AC
  Administered 2017-03-28: 2 mg via INTRAVENOUS

## 2017-03-28 NOTE — ED Triage Notes (Signed)
Pt arrives via [facility] after experiencing a sudden onset of centralized, non-radiating CP. Pt has a hx of MI and uncontrolled HTN. Pt received 324 mg of ASA, 1 NITRO AND 6MG  OF MORPHINE PTA.

## 2017-03-28 NOTE — Code Documentation (Signed)
56 y.o. 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 Patient came to [facility] ED via POV and was met by the stroke team in CT. Per radiology report, CT negative for acute intracranial abnormalities. CTA with no LVO. On exam, patient with LUE sensory loss and LLE drift. tPA not given d/t symptoms being too mild to treat. Not a thrombectomy candidate d/t no LVO. Patient transported to MRI with SRN and ED RN. Per radiology report, MRI negative for acute intracranial abnormalities. Patient transported back to ED. ED bedside handoff with ED RN Elmyra Ricks.
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
## 2017-03-28 NOTE — ED Notes (Signed)
Phlebotomy at bedside.

## 2017-03-28 NOTE — Consult Note (Signed)
Requesting Physician: Dr. Billy Fischer    Chief Complaint: R sided weakness  History obtained from:  Patient     HPI:                                                                                                                                         Evan Mcdaniel is an 56 y.o. male with hx of prostate CA and spinal mets presents with SOB and L sided arm and leg weakness that started at 1130am.  Date last known well: 03/28/17 Time last known well: 34 tPA Given: No: Low and Non disabling symptoms on NIHSS along with hx of spinal metastasis which put him at higher risk for bleeding  MRI brain negative for acute stroke  NIHSS 2, one for sensory and one for L leg drift   Past Medical History:  Diagnosis Date  . Cancer ([place])   . Chronic pain   . Diabetes mellitus without complication ([place])   . Hypertension     Past Surgical History:  Procedure Laterality Date  . PROSTATECTOMY      History reviewed. No pertinent family history. Social History:  reports that he quit smoking about 3 years ago. He has never used smokeless tobacco. He reports that he does not drink alcohol or use drugs.  Allergies:  Allergies  Allergen Reactions  . Naproxen Hives  . Vicodin [Hydrocodone-Acetaminophen] Hives    Medications:                                                                                                                           I have reviewed the patient's current medications.  ROS:  History obtained from the patient  General ROS: negative for - chills, fatigue, fever, night sweats, weight gain or weight loss Psychological ROS: negative for - behavioral disorder, hallucinations, memory   Neurologic Examination:                                                                                                      Blood pressure (!) 146/89, pulse  89, temperature 99 F (37.2 C), temperature source Oral, resp. rate 18, SpO2 99 %.  HEENT-  Normocephalic, no lesions, without obvious abnormality.  Normal external eye and conjunctiva.  Normal TM's bilaterally.  Normal auditory canals and external ears. Normal external nose, mucus membranes and septum.  Normal pharynx. Cardiovascular- regular rate and rhythm, S1, S2 normal, no murmur, click, rub or gallop, pulses palpable throughout   Lungs- chest clear, no wheezing, rales, normal symmetric air entry, Heart exam - S1, S2 normal, no murmur, no gallop, rate regular Abdomen- soft, non-tender; bowel sounds normal; no masses,  no organomegaly Extremities- no edema  Neurological Examination Mental Status: Alert, oriented, thought content appropriate.  Speech fluent without evidence of aphasia.  Able to follow 3 step commands without difficulty. Cranial Nerves: II: Discs flat bilaterally; Visual fields grossly normal,  III,IV, VI: ptosis not present, extra-ocular motions intact bilaterally, pupils equal, round, reactive to light and accommodation V,VII: smile symmetric, decreased on the L face VIII: hearing normal bilaterally IX,X: uvula rises symmetrically XI: bilateral shoulder shrug XII: midline tongue extension Motor: Right : Upper extremity   5/5    Left:     Upper extremity   4/5  Lower extremity   5/5     Lower extremity   4/5 Tone and bulk:normal tone throughout; no atrophy noted Sensory: Pinprick and light touch decreased on the L hemibody Cerebellar: normal finger-to-nose        Lab Results: Basic Metabolic Panel:  Recent Labs Lab 03/28/17 1411  NA 140  K 3.3*  CL 100*  GLUCOSE 110*  BUN 12  CREATININE 0.80    Liver Function Tests: No results for input(s): AST, ALT, ALKPHOS, BILITOT, PROT, ALBUMIN in the last 168 hours. No results for input(s): LIPASE, AMYLASE in the last 168 hours. No results for input(s): AMMONIA in the last 168 hours.  CBC:  Recent  Labs Lab 03/28/17 1349 03/28/17 1411  WBC 7.5  --   NEUTROABS 4.3  --   HGB 12.9* 14.3  HCT 39.3 42.0  MCV 86.9  --   PLT 188  --     Cardiac Enzymes: No results for input(s): CKTOTAL, CKMB, CKMBINDEX, TROPONINI in the last 168 hours.  Lipid Panel: No results for input(s): CHOL, TRIG, HDL, CHOLHDL, VLDL, LDLCALC in the last 168 hours.  CBG:  Recent Labs Lab 03/28/17 1401  GLUCAP 110*    Microbiology: No results found for this or any previous visit.  Coagulation Studies:  Recent Labs  03/28/17 1348  LABPROT 13.6  INR 1.04    Imaging: Ct Head Code Stroke W/o Cm  Result Date: 03/28/2017 CLINICAL DATA:  Code stroke. Left arm weakness  beginning 4 hours ago. History of prostate cancer. EXAM: CT HEAD WITHOUT CONTRAST TECHNIQUE: Contiguous axial images were obtained from the base of the skull through the vertex without intravenous contrast. COMPARISON:  None. FINDINGS: Brain: Normal appearance without evidence of atrophy, old or acute infarction, mass lesion, hemorrhage, hydrocephalus or extra-axial collection. Vascular: No abnormal vascular finding. Skull: Normal Sinuses/Orbits: Clear except for small amount of fluid dependent in the right maxillary sinus. Old right orbital medial wall blowout fracture. Other: None significant ASPECTS ([place] Stroke Program Early CT Score) - Ganglionic level infarction (caudate, lentiform nuclei, internal capsule, insula, M1-M3 cortex): 7 - Supraganglionic infarction (M4-M6 cortex): 3 Total score (0-10 with 10 being normal): 10 IMPRESSION: 1. Normal head CT. 2. ASPECTS is 10. Page placed by telephone at the time of interpretation on 03/28/2017 at 2:17 pm to Dr. Cristobal Goldmann. Electronically Signed   By: Nelson Chimes M.D.   On: 03/28/2017 14:22       03/28/2017, 2:37 PM   Assessment: 56 y.o. male with hx of prostate CA and spinal mets presents with SOB and L sided arm and leg weakness that started at 1130am.  MRI negative for stroke  1. HgbA1c,  fasting lipid panel 2. 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 PT consult, OT consult, Speech consult 4. Echocardiogram 5. Carotid dopplers 6. Prophylactic therapy-Antiplatelet med: Aspirin - dose 325mg  and high dose statin is preferred  7. Risk factor modification 8. Telemetry monitoring 9. Frequent neuro checks 10 NPO until passes stroke swallow screen 11 please page stroke NP  Or  PA  Or MD from 8am -4 pm  as this patient from this time will be  followed by the stroke.   You can look them up on [email or web address]  Password [email or web address]       Stroke Risk Factors - hypercoagulable state

## 2017-03-28 NOTE — H&P (Addendum)
[facility] History and Physical   Patient: Evan Mcdaniel XIO:[id number]   PCP: Elinor Dodge, MD DOB: 23-Sep-1961   DOA: 03/28/2017   DOS: 03/28/2017   DOS: the patient was seen and examined on 03/28/2017  Patient coming from: The patient is coming from home.  Chief Complaint: Chest pain  HPI: [name] is a 56 y.o. male with Past medical history of type 2 diabetes mellitus, hypertension, prostate cancer, chronic pain syndrome, anxiety as well as depression. The patient presented with complaints of severe substernal chest pain, started at 11:30. [place], was not doing any heavy activity or exertion. This chest pain was substernal in location, 10 out of 10 in intensity, pressure-like, associated with shortness of breath as well as nausea and vomiting. No fever no chills. Did not have any diarrhea or constipation. Denies having any recent travel or immobilization, no leg swelling or leg pain. Denies having any similar chest pain in recent past but mentions that he had an episode in 3 months ago. Patient was actually admitted at [facility] underwent cardiac catheterization which was showing normal coronaries in December 2017. Currently chest pain is still 8 out of 10 but no nausea vomiting or shortness of breath no dizziness. Patient also initially complained about having some left-sided numbness which he thinks is associated with chest pain and has currently resolved. No other focal deficit reported.  ED Course: Presented with complaints of chest pain, also had some left-sided numbness. Code stroke was called. Neurology inability of the patient. MRI negative. Patient was admitted for further workup for chest pain.  At his baseline ambulates without any support And is independent for most of his ADL; manages his medication on his own.  Review of Systems: as mentioned in the history of present illness.  All other systems reviewed and  are negative.  Past Medical History:  Diagnosis Date  . Cancer (HCC)   . Chronic pain   . Diabetes mellitus without complication (HCC)   . Hypertension    Past Surgical History:  Procedure Laterality Date  . PROSTATECTOMY     Social History:  reports that he has been smoking Cigarettes.  He has been smoking about 0.25 packs per day. He has never used smokeless tobacco. He reports that he does not drink alcohol or use drugs.  Allergies  Allergen Reactions  . Naproxen Hives  . Vicodin [Hydrocodone-Acetaminophen] Hives   Family History  Problem Relation Age of Onset  . Diabetes Mother   . Heart attack Father 4  . Heart attack Sister   . Heart attack Brother 3  . Heart attack Brother      Prior to Admission medications   Medication Sig Start Date End Date Taking? Authorizing Provider  albuterol (PROVENTIL HFA;VENTOLIN HFA) 108 (90 Base) MCG/ACT inhaler Inhale 2 puffs into the lungs every 6 (six) hours as needed for wheezing or shortness of breath.   Yes [provider]  ALPRAZolam Prudy Feeler) 1 MG tablet Take 1 mg by mouth 3 (three) times daily. 12/29/16  Yes [provider]  amLODipine (NORVASC) 10 MG tablet Take 1 tablet (10 mg total) by mouth daily. 01/15/17  Yes Margarita Grizzle, MD  aspirin EC 81 MG tablet Take 81 mg by mouth daily.    Yes [provider]  busPIRone (BUSPAR) 7.5 MG tablet Take 7.5 mg by mouth 3 (three) times daily. 01/15/17  Yes [provider]  BYSTOLIC 10 MG tablet Take 10 mg  by mouth daily. 02/22/17  Yes [provider]  CHANTIX STARTING MONTH PAK 0.5 MG X 11 & 1 MG X 42 tablet See admin instructions. 01/31/17  Yes [provider]  cloNIDine (CATAPRES) 0.1 MG tablet Take 0.1 mg by mouth 2 (two) times daily as needed. 01/31/17  Yes [provider]  diphenhydrAMINE (BENADRYL) 25 MG tablet Take 50 mg by mouth every 6 (six) hours as needed for allergies.   Yes [provider]  escitalopram (LEXAPRO) 10  MG tablet Take 10 mg by mouth daily.   Yes [provider]  famotidine (PEPCID) 10 MG tablet Take 10 mg by mouth daily. 12/01/16  Yes [provider]  LANTUS SOLOSTAR 100 UNIT/ML Solostar Pen Inject 20 Units into the skin 2 (two) times daily.  12/01/16  Yes [provider]  lisinopril-hydrochlorothiazide (PRINZIDE,ZESTORETIC) 20-12.5 MG tablet Take 1 tablet by mouth 2 (two) times daily.   Yes [provider]  metFORMIN (GLUCOPHAGE) 1000 MG tablet Take 1 tablet (1,000 mg total) by mouth 2 (two) times daily with a meal. 01/15/17  Yes Pattricia Boss, MD  metoprolol succinate (TOPROL-XL) 100 MG 24 hr tablet Take 100 mg by mouth daily. Take with or immediately following a meal.   Yes [provider]  nitroGLYCERIN (NITROSTAT) 0.4 MG SL tablet Place 0.4 mg under the tongue every 5 (five) minutes as needed for chest pain.   Yes [provider]  oxyCODONE (OXYCONTIN) 10 mg 12 hr tablet Take 10 mg by mouth 3 (three) times daily.    Yes [provider]  oxyCODONE (ROXICODONE) 15 MG immediate release tablet Take 15 mg by mouth 2 (two) times daily.  01/11/17  Yes [provider]  phenytoin (DILANTIN) 100 MG ER capsule Take 100 mg by mouth 2 (two) times daily.    Yes [provider]  simvastatin (ZOCOR) 10 MG tablet Take 10 mg by mouth daily at 6 PM.   Yes [provider]  sitaGLIPtin (JANUVIA) 50 MG tablet Take 50 mg by mouth daily.   Yes [provider]  venlafaxine (EFFEXOR) 75 MG tablet Take 3 tablets (225 mg total) by mouth daily. 01/15/17  Yes Pattricia Boss, MD  Vitamin D, Ergocalciferol, (DRISDOL) 50000 units CAPS capsule Take 50,000 Units by mouth every Monday.   Yes [provider]    Physical Exam: Vitals:   03/28/17 8413 03/28/17 1730 03/28/17 1745 03/28/17 1815  BP: (!) 150/90 (!) 166/91 (!) 157/99 (!) 156/117  Pulse: 90 89 90 (!) 101  Resp: _0 Temp:      TempSrc:      SpO2: 97% 97%  97% 99%    General: Alert, Awake and Oriented to Time, Place and Person. Appear in mild distress, affect flat Eyes: PERRL, Conjunctiva normal ENT: Oral Mucosa clear moist Neck: no JVD, no Abnormal Mass Or lumps Cardiovascular: S1 and S2 Present, no Murmur, Peripheral Pulses Present Respiratory: normal respiratory effort, Bilateral Air entry equal and Decreased, no use of accessory muscle, Clear to Auscultation, no Crackles, no wheezes Abdomen: Bowel Sound present, Soft and no tenderness, no hernia Skin: no redness, no Rash, no induration Extremities: no Pedal edema, no calf tenderness Neurologic: Grossly no focal neuro deficit. Bilaterally Equal motor strength  Labs on Admission:  CBC:  Recent Labs Lab 03/28/17 1349 03/28/17 1411  WBC 7.5  --   NEUTROABS 4.3  --   HGB 12.9* 14.3  HCT 39.3 42.0  MCV 86.9  --  PLT 188  --    Basic Metabolic Panel:  Recent Labs Lab 03/28/17 1349 03/28/17 1411  NA 139 140  K 3.3* 3.3*  CL 104 100*  CO2 24  --   GLUCOSE 115* 110*  BUN 10 12  CREATININE 0.89 0.80  CALCIUM 9.5  --    GFR: CrCl cannot be calculated (Unknown ideal weight.). Liver Function Tests:  Recent Labs Lab 03/28/17 1349  AST 19  ALT 16*  ALKPHOS 53  BILITOT 0.6  PROT 7.0  ALBUMIN 4.1   No results for input(s): LIPASE, AMYLASE in the last 168 hours. No results for input(s): AMMONIA in the last 168 hours. Coagulation Profile:  Recent Labs Lab 03/28/17 1348  INR 1.04   Cardiac Enzymes: No results for input(s): CKTOTAL, CKMB, CKMBINDEX, TROPONINI in the last 168 hours. BNP (last 3 results) No results for input(s): PROBNP in the last 8760 hours. HbA1C: No results for input(s): HGBA1C in the last 72 hours. CBG:  Recent Labs Lab 03/28/17 1401  GLUCAP 110*   Lipid Profile: No results for input(s): CHOL, HDL, LDLCALC, TRIG, CHOLHDL, LDLDIRECT in the last 72 hours. Thyroid Function Tests: No results for input(s): TSH, T4TOTAL, FREET4, T3FREE,  THYROIDAB in the last 72 hours. Anemia Panel: No results for input(s): VITAMINB12, FOLATE, FERRITIN, TIBC, IRON, RETICCTPCT in the last 72 hours. Urine analysis:    Component Value Date/Time   COLORURINE STRAW (A) 03/28/2017 1426   APPEARANCEUR CLEAR 03/28/2017 1426   LABSPEC 1.005 03/28/2017 1426   PHURINE 5.0 03/28/2017 1426   GLUCOSEU NEGATIVE 03/28/2017 1426   HGBUR NEGATIVE 03/28/2017 1426   BILIRUBINUR NEGATIVE 03/28/2017 1426   KETONESUR NEGATIVE 03/28/2017 1426   PROTEINUR NEGATIVE 03/28/2017 1426   NITRITE NEGATIVE 03/28/2017 1426   LEUKOCYTESUR NEGATIVE 03/28/2017 1426    Radiological Exams on Admission: Ct Angio Head W Or Wo Contrast  Result Date: 03/28/2017 CLINICAL DATA:  Left arm weakness EXAM: CT ANGIOGRAPHY HEAD AND NECK TECHNIQUE: Multidetector CT imaging of the head and neck was performed using the standard protocol during bolus administration of intravenous contrast. Multiplanar CT image reconstructions and MIPs were obtained to evaluate the vascular anatomy. Carotid stenosis measurements (when applicable) are obtained utilizing NASCET criteria, using the distal internal carotid diameter as the denominator. CONTRAST:  50 mL Isovue 370 COMPARISON:  Head CT 03/28/2017 FINDINGS: CTA NECK FINDINGS Aortic arch: There is no aneurysm or dissection of the visualized ascending aorta or aortic arch. There is a normal 3 vessel branching pattern. The visualized proximal subclavian arteries are normal. There is calcific aortic atherosclerosis. Right carotid system: The right common carotid origin is widely patent. There is no common carotid or internal carotid artery dissection or aneurysm. No hemodynamically significant stenosis. Left carotid system: The left common carotid origin is widely patent. There is no common carotid or internal carotid artery dissection or aneurysm. No hemodynamically significant stenosis. Vertebral arteries: The vertebral system is right dominant. Both  vertebral artery origins are normal. The left vertebral artery is congenitally hypoplastic and terminates in the left posterior inferior cerebellar artery. Skeleton: There is no bony spinal canal stenosis. No lytic or blastic lesions. Other neck: The nasopharynx is clear. The oropharynx and hypopharynx are normal. The epiglottis is normal. The supraglottic larynx, glottis and subglottic larynx are normal. No retropharyngeal collection. The parapharyngeal spaces are preserved. The parotid and submandibular glands are normal. No sialolithiasis or salivary ductal dilatation. The thyroid gland is normal. There is no cervical lymphadenopathy. Upper chest: No pneumothorax or pleural  effusion. No nodules or masses. Review of the MIP images confirms the above findings CTA HEAD FINDINGS Anterior circulation: --Intracranial internal carotid arteries: No hemodynamically significant stenosis. --Anterior cerebral arteries: There is approximately 50% atherosclerotic narrowing of the right A1 segment. Otherwise normal. --Middle cerebral arteries: There is severe stenosis at the origin of the right M2 segment superior division, which remains patent. There is atherosclerotic irregularity of the M1 segments, but no hemodynamically significant stenosis. --Posterior communicating arteries: Present on the left. Posterior circulation: --Posterior cerebral arteries: Normal. --Superior cerebellar arteries: Normal. --Basilar artery: Normal. --Anterior inferior cerebellar arteries: Normal. --Posterior inferior cerebellar arteries: Normal. Venous sinuses: As permitted by contrast timing, patent. Anatomic variants: None Delayed phase: Not performed Review of the MIP images confirms the above findings IMPRESSION: 1. No emergent large vessel occlusion. 2. Severe stenosis of the proximal right M2 segment superior division, which remains patent distally. 3. Multifocal atherosclerotic irregularity of the intracranial arteries without  hemodynamically significant stenosis of the major vessels other than that described above. 4. Normal CTA of the neck. 5.  Aortic Atherosclerosis (ICD10-I70.0). Electronically Signed   By: Ulyses Jarred M.D.   On: 03/28/2017 14:57   Dg Chest 2 View  Result Date: 03/28/2017 CLINICAL DATA:  Patient with mid chest pain. Left arm and hand pain. Left hand numbness. EXAM: CHEST  2 VIEW COMPARISON:  Chest radiograph 01/15/2017. FINDINGS: Monitoring leads overlie the patient. Stable cardiac and mediastinal contours. Tortuosity of the thoracic aorta. Low lung volumes. No consolidative pulmonary opacities. No pleural effusion or pneumothorax. Mid thoracic spine degenerative changes. IMPRESSION: No acute cardiopulmonary process. Electronically Signed   By: Lovey Newcomer M.D.   On: 03/28/2017 17:11   Ct Angio Neck W Or Wo Contrast  Result Date: 03/28/2017 CLINICAL DATA:  Left arm weakness EXAM: CT ANGIOGRAPHY HEAD AND NECK TECHNIQUE: Multidetector CT imaging of the head and neck was performed using the standard protocol during bolus administration of intravenous contrast. Multiplanar CT image reconstructions and MIPs were obtained to evaluate the vascular anatomy. Carotid stenosis measurements (when applicable) are obtained utilizing NASCET criteria, using the distal internal carotid diameter as the denominator. CONTRAST:  50 mL Isovue 370 COMPARISON:  Head CT 03/28/2017 FINDINGS: CTA NECK FINDINGS Aortic arch: There is no aneurysm or dissection of the visualized ascending aorta or aortic arch. There is a normal 3 vessel branching pattern. The visualized proximal subclavian arteries are normal. There is calcific aortic atherosclerosis. Right carotid system: The right common carotid origin is widely patent. There is no common carotid or internal carotid artery dissection or aneurysm. No hemodynamically significant stenosis. Left carotid system: The left common carotid origin is widely patent. There is no common carotid or  internal carotid artery dissection or aneurysm. No hemodynamically significant stenosis. Vertebral arteries: The vertebral system is right dominant. Both vertebral artery origins are normal. The left vertebral artery is congenitally hypoplastic and terminates in the left posterior inferior cerebellar artery. Skeleton: There is no bony spinal canal stenosis. No lytic or blastic lesions. Other neck: The nasopharynx is clear. The oropharynx and hypopharynx are normal. The epiglottis is normal. The supraglottic larynx, glottis and subglottic larynx are normal. No retropharyngeal collection. The parapharyngeal spaces are preserved. The parotid and submandibular glands are normal. No sialolithiasis or salivary ductal dilatation. The thyroid gland is normal. There is no cervical lymphadenopathy. Upper chest: No pneumothorax or pleural effusion. No nodules or masses. Review of the MIP images confirms the above findings CTA HEAD FINDINGS Anterior circulation: --Intracranial internal carotid arteries: No  hemodynamically significant stenosis. --Anterior cerebral arteries: There is approximately 50% atherosclerotic narrowing of the right A1 segment. Otherwise normal. --Middle cerebral arteries: There is severe stenosis at the origin of the right M2 segment superior division, which remains patent. There is atherosclerotic irregularity of the M1 segments, but no hemodynamically significant stenosis. --Posterior communicating arteries: Present on the left. Posterior circulation: --Posterior cerebral arteries: Normal. --Superior cerebellar arteries: Normal. --Basilar artery: Normal. --Anterior inferior cerebellar arteries: Normal. --Posterior inferior cerebellar arteries: Normal. Venous sinuses: As permitted by contrast timing, patent. Anatomic variants: None Delayed phase: Not performed Review of the MIP images confirms the above findings IMPRESSION: 1. No emergent large vessel occlusion. 2. Severe stenosis of the proximal right  M2 segment superior division, which remains patent distally. 3. Multifocal atherosclerotic irregularity of the intracranial arteries without hemodynamically significant stenosis of the major vessels other than that described above. 4. Normal CTA of the neck. 5.  Aortic Atherosclerosis (ICD10-I70.0). Electronically Signed   By: Ulyses Jarred M.D.   On: 03/28/2017 14:57   Mr Brain Wo Contrast  Result Date: 03/28/2017 CLINICAL DATA:  56 year old male code stroke presentation. Left arm weakness. Head and neck CTA remarkable for right MCA M2 stenosis. EXAM: MRI HEAD WITHOUT CONTRAST TECHNIQUE: Multiplanar, multiecho pulse sequences of the brain and surrounding structures were obtained without intravenous contrast. COMPARISON:  CTA head and neck 1431 hours today. Head CT without contrast 1415 hours. FINDINGS: Brain: No restricted diffusion to suggest acute infarction. No midline shift, mass effect, evidence of mass lesion, ventriculomegaly, extra-axial collection or acute intracranial hemorrhage. Cervicomedullary junction and pituitary are within normal limits. Pearline Cables and white matter signal is within normal limits for age throughout the brain. No cortical encephalomalacia or chronic cerebral blood products identified. Vascular: Major intracranial vascular flow voids are preserved, the distal right vertebral artery is dominant. Skull and upper cervical spine: Negative. Normal bone marrow signal. Sinuses/Orbits: Normal orbits soft tissues. Mild right maxillary sinus mucosal thickening, other Visualized paranasal sinuses and mastoids are stable and well pneumatized. Other: Visible internal auditory structures appear normal. Negative scalp and orbits soft tissues. IMPRESSION: No acute intracranial abnormality. Normal for age noncontrast MRI appearance of the brain. Electronically Signed   By: Genevie Ann M.D.   On: 03/28/2017 15:49   Ct Head Code Stroke W/o Cm  Result Date: 03/28/2017 CLINICAL DATA:  Code stroke. Left arm  weakness beginning 4 hours ago. History of prostate cancer. EXAM: CT HEAD WITHOUT CONTRAST TECHNIQUE: Contiguous axial images were obtained from the base of the skull through the vertex without intravenous contrast. COMPARISON:  None. FINDINGS: Brain: Normal appearance without evidence of atrophy, old or acute infarction, mass lesion, hemorrhage, hydrocephalus or extra-axial collection. Vascular: No abnormal vascular finding. Skull: Normal Sinuses/Orbits: Clear except for small amount of fluid dependent in the right maxillary sinus. Old right orbital medial wall blowout fracture. Other: None significant ASPECTS ([place] Stroke Program Early CT Score) - Ganglionic level infarction (caudate, lentiform nuclei, internal capsule, insula, M1-M3 cortex): 7 - Supraganglionic infarction (M4-M6 cortex): 3 Total score (0-10 with 10 being normal): 10 IMPRESSION: 1. Normal head CT. 2. ASPECTS is 10. Page placed by telephone at the time of interpretation on 03/28/2017 at 2:17 pm to Dr. Cristobal Goldmann. Electronically Signed   By: Nelson Chimes M.D.   On: 03/28/2017 14:22   EKG: Independently reviewed. normal sinus rhythm, nonspecific ST and T waves changes.  Assessment/Plan 1. Chest pain Presented with chest pain, currently still a total 10. Persistent since onset 11:30. Symptoms worse when  he lays on his left side. EKG unremarkable initial troponin unremarkable. X-ray chest unremarkable. No aortic dissection reported on CT scan angiogram. Presented with severe hypertension, possibly due to increased demand. No evidence of pericarditis but the pain worsens with movement. We did check ESR and CRP. With active chest pain monitoring step down unit, serial troponin. Echocardiogram to rule out acute pericarditis. Recent cardiac catheterization in December 2017 showed no coronary artery disease per the report. Do not think the patient would benefit from a stress test or a repeat cath unless his troponin are positive or  echocardiogram is positive for any acute abnormality. Patient refuses any IV narcotics. Monitor. With history of prostate cancer as well as persistent somewhat pleuritic nature of chest pain, patient's well's score is 3. We'll get CT angina chest to rule out pulmonary embolism. Patient has received IV contrast today, we'll likely get a CT chest tomorrow. Given the lack of tachycardia, hypoxia and hypotension would not empirically treat the patient at present. Check d-dimer and BNP.  2. Essential hypertension. Hypertensive urgency. Presented with complains of chest pain as well as left-sided numbness. The chest pain is getting better as well as the numbness is resolved. Per EMS blood pressure was 200 when they were provided to the patient. Here pressure was 170/100. Patient mentions that he has not missed any of his blood pressure medication. Also denies using any drugs or cocaine. Continuing home regimen. Change in [place] from when necessary to scheduled. Monitor. At today's appointment. Monitor the stepdown unit.  3. Left-sided numbness. MRI brain rules out acute stroke. He initially called code stroke, appreciate neurology input. CT head angiogram head and neck also does not show any acute abnormality. No further workup recommended. PTOT consult.  4. Chronic pain syndrome. Reviewed patient's record on and so controlled substance axis site. Patient has been getting oxycodone as well as OxyContin as well as Xanax. Resuming all 3. Patient refuses IV narcotics.  5. Anxiety as well as depression. Continuing home regimen.  6. Active smoker. Nicotine patch. counseled quit smoking.  Nutrition: Heart healthy carb modified diet DVT Prophylaxis: subcutaneous Heparin  Advance goals of care discussion: Full code   Consults: Neurology  Family Communication: family was present at bedside, at the time of interview.  Opportunity was given to ask question and all questions were  answered satisfactorily.  Disposition: Admitted as observation, step-down unit. Likely to be discharged home, in 1 days.  Author: Berle Mull, MD [facility] Pager: [phone] 03/28/2017  If 7PM-7AM, please contact night-coverage [email or web address] Password [email or web address]

## 2017-03-28 NOTE — ED Notes (Signed)
ATTEMPTED REPORT TO 3W.

## 2017-03-29 ENCOUNTER — Observation Stay (HOSPITAL_COMMUNITY): Payer: Medicaid Other

## 2017-03-29 ENCOUNTER — Encounter (HOSPITAL_COMMUNITY): Payer: Self-pay | Admitting: Student

## 2017-03-29 ENCOUNTER — Observation Stay (HOSPITAL_BASED_OUTPATIENT_CLINIC_OR_DEPARTMENT_OTHER): Payer: Medicaid Other

## 2017-03-29 DIAGNOSIS — R079 Chest pain, unspecified: Secondary | ICD-10-CM | POA: Diagnosis not present

## 2017-03-29 DIAGNOSIS — E119 Type 2 diabetes mellitus without complications: Secondary | ICD-10-CM | POA: Diagnosis not present

## 2017-03-29 DIAGNOSIS — R0789 Other chest pain: Secondary | ICD-10-CM

## 2017-03-29 DIAGNOSIS — R200 Anesthesia of skin: Secondary | ICD-10-CM | POA: Diagnosis not present

## 2017-03-29 DIAGNOSIS — R531 Weakness: Secondary | ICD-10-CM | POA: Diagnosis not present

## 2017-03-29 DIAGNOSIS — G894 Chronic pain syndrome: Secondary | ICD-10-CM | POA: Diagnosis not present

## 2017-03-29 DIAGNOSIS — I10 Essential (primary) hypertension: Secondary | ICD-10-CM

## 2017-03-29 LAB — CBC WITH DIFFERENTIAL/PLATELET
BASOS ABS: 0 10*3/uL (ref 0.0–0.1)
BASOS PCT: 0 %
Eosinophils Absolute: 0.1 10*3/uL (ref 0.0–0.7)
Eosinophils Relative: 1 %
HEMATOCRIT: 38 % — AB (ref 39.0–52.0)
Hemoglobin: 12 g/dL — ABNORMAL LOW (ref 13.0–17.0)
LYMPHS PCT: 46 %
Lymphs Abs: 3.8 10*3/uL (ref 0.7–4.0)
MCH: 28.2 pg (ref 26.0–34.0)
MCHC: 31.6 g/dL (ref 30.0–36.0)
MCV: 89.4 fL (ref 78.0–100.0)
Monocytes Absolute: 0.7 10*3/uL (ref 0.1–1.0)
Monocytes Relative: 8 %
NEUTROS ABS: 3.7 10*3/uL (ref 1.7–7.7)
Neutrophils Relative %: 45 %
PLATELETS: 176 10*3/uL (ref 150–400)
RBC: 4.25 MIL/uL (ref 4.22–5.81)
RDW: 14.6 % (ref 11.5–15.5)
WBC: 8.3 10*3/uL (ref 4.0–10.5)

## 2017-03-29 LAB — GLUCOSE, CAPILLARY
GLUCOSE-CAPILLARY: 143 mg/dL — AB (ref 65–99)
GLUCOSE-CAPILLARY: 94 mg/dL (ref 65–99)
Glucose-Capillary: 129 mg/dL — ABNORMAL HIGH (ref 65–99)
Glucose-Capillary: 111 mg/dL — ABNORMAL HIGH (ref 65–99)

## 2017-03-29 LAB — MRSA PCR SCREENING: MRSA BY PCR: NEGATIVE

## 2017-03-29 LAB — TROPONIN I: Troponin I: <0.03 ng/mL (ref <0.03)

## 2017-03-29 LAB — HIV ANTIBODY (ROUTINE TESTING W REFLEX): HIV SCREEN 4TH GENERATION: NONREACTIVE

## 2017-03-29 LAB — COMPREHENSIVE METABOLIC PANEL
ALBUMIN: 3.6 g/dL (ref 3.5–5.0)
ALK PHOS: 48 U/L (ref 38–126)
ALT: 13 U/L — ABNORMAL LOW (ref 17–63)
ANION GAP: 7 (ref 5–15)
AST: 15 U/L (ref 15–41)
BUN: 11 mg/dL (ref 6–20)
CALCIUM: 8.9 mg/dL (ref 8.9–10.3)
CO2: 28 mmol/L (ref 22–32)
Chloride: 104 mmol/L (ref 101–111)
Creatinine, Ser: 0.86 mg/dL (ref 0.61–1.24)
GLUCOSE: 105 mg/dL — AB (ref 65–99)
POTASSIUM: 3.5 mmol/L (ref 3.5–5.1)
Sodium: 139 mmol/L (ref 135–145)
TOTAL PROTEIN: 6.3 g/dL — AB (ref 6.5–8.1)
Total Bilirubin: 0.4 mg/dL (ref 0.3–1.2)

## 2017-03-29 LAB — ECHOCARDIOGRAM COMPLETE: Weight: 3187.2 oz

## 2017-03-29 LAB — PROTIME-INR
INR: 1.06
PROTHROMBIN TIME: 13.9 s (ref 11.4–15.2)

## 2017-03-29 LAB — MAGNESIUM: Magnesium: 1.8 mg/dL (ref 1.7–2.4)

## 2017-03-29 MED ORDER — GADOBENATE DIMEGLUMINE 529 MG/ML IV SOLN
20.0000 mL | Freq: Once | INTRAVENOUS | Status: AC | PRN
  Administered 2017-03-29: 19 mL via INTRAVENOUS

## 2017-03-29 MED ORDER — OXYCODONE HCL 5 MG PO TABS
5.0000 mg | ORAL_TABLET | Freq: Once | ORAL | Status: AC
  Administered 2017-03-29: 5 mg via ORAL
  Filled 2017-03-29: qty 1

## 2017-03-29 MED ORDER — POTASSIUM CHLORIDE CRYS ER 20 MEQ PO TBCR
40.0000 meq | EXTENDED_RELEASE_TABLET | Freq: Once | ORAL | Status: AC
  Administered 2017-03-29: 40 meq via ORAL
  Filled 2017-03-29: qty 2

## 2017-03-29 MED ORDER — HYDROCHLOROTHIAZIDE 12.5 MG PO CAPS
12.5000 mg | ORAL_CAPSULE | Freq: Two times a day (BID) | ORAL | Status: DC
  Administered 2017-03-29: 12.5 mg via ORAL
  Filled 2017-03-29 (×4): qty 1

## 2017-03-29 MED ORDER — LISINOPRIL-HYDROCHLOROTHIAZIDE 20-12.5 MG PO TABS: 1.0000 | ORAL_TABLET | Freq: Two times a day (BID) | ORAL | Status: DC

## 2017-03-29 MED ORDER — MAGNESIUM SULFATE 2 GM/50ML IV SOLN
2.0000 g | Freq: Once | INTRAVENOUS | Status: AC
  Administered 2017-03-29: 2 g via INTRAVENOUS

## 2017-03-29 MED ORDER — LISINOPRIL 20 MG PO TABS
20.0000 mg | ORAL_TABLET | Freq: Two times a day (BID) | ORAL | Status: DC
  Administered 2017-03-29 – 2017-03-30 (×3): 20 mg via ORAL
  Filled 2017-03-29 (×4): qty 1
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
## 2017-03-29 NOTE — Progress Notes (Signed)
PROGRESS NOTE    Evan Mcdaniel  YIR:[id number] DOB: 1961-09-18 DOA: 03/28/2017 PCP: [name], MD    Brief Narrative:  Patient is a 56 year old gentleman history of diabetes, hypertension, prostate cancer, chronic pain syndrome, depression/anxiety presenting with severe substernal chest pain and left upper extremity weakness and numbness. Cardiology and neurology consulted.   Assessment & Plan:   Principal Problem:   Atypical chest pain Active Problems:   Accelerated hypertension   Diabetes mellitus without complication (HCC)   Chronic pain   Cancer (HCC)   Left sided numbness   Marijuana use  #1 atypical chest pain Present is presenting with atypical chest pain however multiple risk factors of hypertension, hyperlipidemia, hypertensive urgency, tobacco abuse. Patient noted to have a left heart catheterization done at [facility] December 2017 that had no coronary artery disease. EKG done with no ischemic changes. Cardiac enzymes negative 3. Patient noted to still elevated blood pressure on home regimen of hydrochlorothiazide and lisinopril resumed. 2-D echo done with EF of 60-65%, no wall motion abnormalities. Grade 1 diastolic dysfunction. D-dimer was negative. Patient has been seen in consultation by cardiology and recommended no further workup. Patient's chest pain noted likely to be stress-related.  #2 accelerated hypertension Continue Norvasc, clonidine, bystolic. Resume Prinzide.  #3 left arm numbness MRI CT head negative for acute CVA. MRI of the CMT spine ordered per neurology. Neurology following and appreciate input and recommendations.  #4 diabetes mellitus CBGs ranging from 105-115. Check a hemoglobin A1c. Continue Lantus and sliding scale insulin.  #5 hyperlipidemia Continue statin.  #6 PTSD Follow-up at the [place].  #7 chronic pain syndrome Continue home regimen of OxyContin, oxycodone and Xanax.  #8 depression/anxiety Continue BuSpar, Lexapro.  #9  tobacco abuse   tobacco cessation. Nicotine patch.     DVT prophylaxis: Lovenox Code Status: Full Family Communication: Updated patient and family at bedside. Disposition Plan:    Consultants:   Cardiology: [name] 03/29/2017  Neurology: Dr. Bertram Denver 03/28/2017  Procedures:   MRI brain 03/28/2017  CT head 03/28/2017  2-D echo 03/29/2017 pending  Chest x-ray 03/28/2017  CT angiogram head and neck 03/28/2017  Antimicrobials:   None   Subjective: Patient still with some complaints of chest pain. Patient denies any shortness of breath. Patient with complaints of left upper extremity numbness.  Objective: Vitals:   03/28/17 2305 03/29/17 0025 03/29/17 0440 03/29/17 0736  BP: (!) 153/104 (!) 137/99 (!) 142/107 (!) 145/102  Pulse:  68 65 (!) 151  Resp:  12 (!) 21 (!) 8  Temp:  98.3 F (36.8 C) 97.7 F (36.5 C) 97.9 F (36.6 C)  TempSrc:  Oral Oral Oral  SpO2:  98% 100% 99%  Weight:   90.4 kg (199 lb 3.2 oz)     Intake/Output Summary (Last 24 hours) at 03/29/17 1335 Last data filed at 03/29/17 0600  Gross per 24 hour  Intake           710.75 ml  Output                0 ml  Net           710.75 ml   Filed Weights   03/28/17 2006 03/29/17 0440  Weight: 90.2 kg (198 lb 12.8 oz) 90.4 kg (199 lb 3.2 oz)    Examination:  General exam: Appears calm and comfortable  Respiratory system: Clear to auscultation. Respiratory effort normal. Cardiovascular system: S1 & S2 heard, RRR. No JVD, murmurs, rubs, gallops or clicks. No pedal  edema. Gastrointestinal system: Abdomen is nondistended, soft and nontender. No organomegaly or masses felt. Normal bowel sounds heard. Central nervous system: Alert and oriented. No focal neurological deficits. Extremities: Symmetric 5 x 5 power. Skin: No rashes, lesions or ulcers Psychiatry: Judgement and insight appear normal. Mood & affect appropriate.     Data Reviewed: I have personally reviewed following labs and  imaging studies  CBC:  Recent Labs Lab 03/28/17 1349 03/28/17 1411 03/29/17 0534  WBC 7.5  --  8.3  NEUTROABS 4.3  --  3.7  HGB 12.9* 14.3 12.0*  HCT 39.3 42.0 38.0*  MCV 86.9  --  89.4  PLT 188  --  836   Basic Metabolic Panel:  Recent Labs Lab 03/28/17 1349 03/28/17 1411 03/29/17 0534  NA 139 140 139  K 3.3* 3.3* 3.5  CL 104 100* 104  CO2 24  --  28  GLUCOSE 115* 110* 105*  BUN 10 12 11   CREATININE 0.89 0.80 0.86  CALCIUM 9.5  --  8.9  MG  --   --  1.8   GFR: Estimated Creatinine Clearance: 110.3 mL/min (by C-G formula based on SCr of 0.86 mg/dL). Liver Function Tests:  Recent Labs Lab 03/28/17 1349 03/29/17 0534  AST 19 15  ALT 16* 13*  ALKPHOS 53 48  BILITOT 0.6 0.4  PROT 7.0 6.3*  ALBUMIN 4.1 3.6   No results for input(s): LIPASE, AMYLASE in the last 168 hours. No results for input(s): AMMONIA in the last 168 hours. Coagulation Profile:  Recent Labs Lab 03/28/17 1348 03/29/17 0534  INR 1.04 1.06   Cardiac Enzymes:  Recent Labs Lab 03/28/17 2002 03/29/17 0016 03/29/17 0534  TROPONINI <0.03 <0.03 <0.03   BNP (last 3 results) No results for input(s): PROBNP in the last 8760 hours. HbA1C: No results for input(s): HGBA1C in the last 72 hours. CBG:  Recent Labs Lab 03/28/17 1401 03/28/17 2041 03/29/17 0734 03/29/17 1137  GLUCAP 110* 94 129* 143*   Lipid Profile: No results for input(s): CHOL, HDL, LDLCALC, TRIG, CHOLHDL, LDLDIRECT in the last 72 hours. Thyroid Function Tests: No results for input(s): TSH, T4TOTAL, FREET4, T3FREE, THYROIDAB in the last 72 hours. Anemia Panel: No results for input(s): VITAMINB12, FOLATE, FERRITIN, TIBC, IRON, RETICCTPCT in the last 72 hours. Sepsis Labs: No results for input(s): PROCALCITON, LATICACIDVEN in the last 168 hours.  Recent Results (from the past 240 hour(s))  MRSA PCR Screening     Status: None   Collection Time: 03/28/17  9:23 PM  Result Value Ref Range Status   MRSA by PCR  NEGATIVE NEGATIVE Final    Comment:        The GeneXpert MRSA Assay (FDA approved for NASAL specimens only), is one component of a comprehensive MRSA colonization surveillance program. It is not intended to diagnose MRSA infection nor to guide or monitor treatment for MRSA infections.          Radiology Studies: Ct Angio Head W Or Wo Contrast  Result Date: 03/28/2017 CLINICAL DATA:  Left arm weakness EXAM: CT ANGIOGRAPHY HEAD AND NECK TECHNIQUE: Multidetector CT imaging of the head and neck was performed using the standard protocol during bolus administration of intravenous contrast. Multiplanar CT image reconstructions and MIPs were obtained to evaluate the vascular anatomy. Carotid stenosis measurements (when applicable) are obtained utilizing NASCET criteria, using the distal internal carotid diameter as the denominator. CONTRAST:  50 mL Isovue 370 COMPARISON:  Head CT 03/28/2017 FINDINGS: CTA NECK FINDINGS Aortic arch: There is no  aneurysm or dissection of the visualized ascending aorta or aortic arch. There is a normal 3 vessel branching pattern. The visualized proximal subclavian arteries are normal. There is calcific aortic atherosclerosis. Right carotid system: The right common carotid origin is widely patent. There is no common carotid or internal carotid artery dissection or aneurysm. No hemodynamically significant stenosis. Left carotid system: The left common carotid origin is widely patent. There is no common carotid or internal carotid artery dissection or aneurysm. No hemodynamically significant stenosis. Vertebral arteries: The vertebral system is right dominant. Both vertebral artery origins are normal. The left vertebral artery is congenitally hypoplastic and terminates in the left posterior inferior cerebellar artery. Skeleton: There is no bony spinal canal stenosis. No lytic or blastic lesions. Other neck: The nasopharynx is clear. The oropharynx and hypopharynx are normal.  The epiglottis is normal. The supraglottic larynx, glottis and subglottic larynx are normal. No retropharyngeal collection. The parapharyngeal spaces are preserved. The parotid and submandibular glands are normal. No sialolithiasis or salivary ductal dilatation. The thyroid gland is normal. There is no cervical lymphadenopathy. Upper chest: No pneumothorax or pleural effusion. No nodules or masses. Review of the MIP images confirms the above findings CTA HEAD FINDINGS Anterior circulation: --Intracranial internal carotid arteries: No hemodynamically significant stenosis. --Anterior cerebral arteries: There is approximately 50% atherosclerotic narrowing of the right A1 segment. Otherwise normal. --Middle cerebral arteries: There is severe stenosis at the origin of the right M2 segment superior division, which remains patent. There is atherosclerotic irregularity of the M1 segments, but no hemodynamically significant stenosis. --Posterior communicating arteries: Present on the left. Posterior circulation: --Posterior cerebral arteries: Normal. --Superior cerebellar arteries: Normal. --Basilar artery: Normal. --Anterior inferior cerebellar arteries: Normal. --Posterior inferior cerebellar arteries: Normal. Venous sinuses: As permitted by contrast timing, patent. Anatomic variants: None Delayed phase: Not performed Review of the MIP images confirms the above findings IMPRESSION: 1. No emergent large vessel occlusion. 2. Severe stenosis of the proximal right M2 segment superior division, which remains patent distally. 3. Multifocal atherosclerotic irregularity of the intracranial arteries without hemodynamically significant stenosis of the major vessels other than that described above. 4. Normal CTA of the neck. 5.  Aortic Atherosclerosis (ICD10-I70.0). Electronically Signed   By: Ulyses Jarred M.D.   On: 03/28/2017 14:57   Dg Chest 2 View  Result Date: 03/28/2017 CLINICAL DATA:  Patient with mid chest pain. Left arm  and hand pain. Left hand numbness. EXAM: CHEST  2 VIEW COMPARISON:  Chest radiograph 01/15/2017. FINDINGS: Monitoring leads overlie the patient. Stable cardiac and mediastinal contours. Tortuosity of the thoracic aorta. Low lung volumes. No consolidative pulmonary opacities. No pleural effusion or pneumothorax. Mid thoracic spine degenerative changes. IMPRESSION: No acute cardiopulmonary process. Electronically Signed   By: Lovey Newcomer M.D.   On: 03/28/2017 17:11   Ct Angio Neck W Or Wo Contrast  Result Date: 03/28/2017 CLINICAL DATA:  Left arm weakness EXAM: CT ANGIOGRAPHY HEAD AND NECK TECHNIQUE: Multidetector CT imaging of the head and neck was performed using the standard protocol during bolus administration of intravenous contrast. Multiplanar CT image reconstructions and MIPs were obtained to evaluate the vascular anatomy. Carotid stenosis measurements (when applicable) are obtained utilizing NASCET criteria, using the distal internal carotid diameter as the denominator. CONTRAST:  50 mL Isovue 370 COMPARISON:  Head CT 03/28/2017 FINDINGS: CTA NECK FINDINGS Aortic arch: There is no aneurysm or dissection of the visualized ascending aorta or aortic arch. There is a normal 3 vessel branching pattern. The visualized proximal subclavian arteries  are normal. There is calcific aortic atherosclerosis. Right carotid system: The right common carotid origin is widely patent. There is no common carotid or internal carotid artery dissection or aneurysm. No hemodynamically significant stenosis. Left carotid system: The left common carotid origin is widely patent. There is no common carotid or internal carotid artery dissection or aneurysm. No hemodynamically significant stenosis. Vertebral arteries: The vertebral system is right dominant. Both vertebral artery origins are normal. The left vertebral artery is congenitally hypoplastic and terminates in the left posterior inferior cerebellar artery. Skeleton: There is no  bony spinal canal stenosis. No lytic or blastic lesions. Other neck: The nasopharynx is clear. The oropharynx and hypopharynx are normal. The epiglottis is normal. The supraglottic larynx, glottis and subglottic larynx are normal. No retropharyngeal collection. The parapharyngeal spaces are preserved. The parotid and submandibular glands are normal. No sialolithiasis or salivary ductal dilatation. The thyroid gland is normal. There is no cervical lymphadenopathy. Upper chest: No pneumothorax or pleural effusion. No nodules or masses. Review of the MIP images confirms the above findings CTA HEAD FINDINGS Anterior circulation: --Intracranial internal carotid arteries: No hemodynamically significant stenosis. --Anterior cerebral arteries: There is approximately 50% atherosclerotic narrowing of the right A1 segment. Otherwise normal. --Middle cerebral arteries: There is severe stenosis at the origin of the right M2 segment superior division, which remains patent. There is atherosclerotic irregularity of the M1 segments, but no hemodynamically significant stenosis. --Posterior communicating arteries: Present on the left. Posterior circulation: --Posterior cerebral arteries: Normal. --Superior cerebellar arteries: Normal. --Basilar artery: Normal. --Anterior inferior cerebellar arteries: Normal. --Posterior inferior cerebellar arteries: Normal. Venous sinuses: As permitted by contrast timing, patent. Anatomic variants: None Delayed phase: Not performed Review of the MIP images confirms the above findings IMPRESSION: 1. No emergent large vessel occlusion. 2. Severe stenosis of the proximal right M2 segment superior division, which remains patent distally. 3. Multifocal atherosclerotic irregularity of the intracranial arteries without hemodynamically significant stenosis of the major vessels other than that described above. 4. Normal CTA of the neck. 5.  Aortic Atherosclerosis (ICD10-I70.0). Electronically Signed   By:  Ulyses Jarred M.D.   On: 03/28/2017 14:57   Mr Brain Wo Contrast  Result Date: 03/28/2017 CLINICAL DATA:  56 year old male code stroke presentation. Left arm weakness. Head and neck CTA remarkable for right MCA M2 stenosis. EXAM: MRI HEAD WITHOUT CONTRAST TECHNIQUE: Multiplanar, multiecho pulse sequences of the brain and surrounding structures were obtained without intravenous contrast. COMPARISON:  CTA head and neck 1431 hours today. Head CT without contrast 1415 hours. FINDINGS: Brain: No restricted diffusion to suggest acute infarction. No midline shift, mass effect, evidence of mass lesion, ventriculomegaly, extra-axial collection or acute intracranial hemorrhage. Cervicomedullary junction and pituitary are within normal limits. Pearline Cables and white matter signal is within normal limits for age throughout the brain. No cortical encephalomalacia or chronic cerebral blood products identified. Vascular: Major intracranial vascular flow voids are preserved, the distal right vertebral artery is dominant. Skull and upper cervical spine: Negative. Normal bone marrow signal. Sinuses/Orbits: Normal orbits soft tissues. Mild right maxillary sinus mucosal thickening, other Visualized paranasal sinuses and mastoids are stable and well pneumatized. Other: Visible internal auditory structures appear normal. Negative scalp and orbits soft tissues. IMPRESSION: No acute intracranial abnormality. Normal for age noncontrast MRI appearance of the brain. Electronically Signed   By: Genevie Ann M.D.   On: 03/28/2017 15:49   Ct Head Code Stroke W/o Cm  Result Date: 03/28/2017 CLINICAL DATA:  Code stroke. Left arm weakness beginning 4 hours  ago. History of prostate cancer. EXAM: CT HEAD WITHOUT CONTRAST TECHNIQUE: Contiguous axial images were obtained from the base of the skull through the vertex without intravenous contrast. COMPARISON:  None. FINDINGS: Brain: Normal appearance without evidence of atrophy, old or acute infarction, mass  lesion, hemorrhage, hydrocephalus or extra-axial collection. Vascular: No abnormal vascular finding. Skull: Normal Sinuses/Orbits: Clear except for small amount of fluid dependent in the right maxillary sinus. Old right orbital medial wall blowout fracture. Other: None significant ASPECTS ([place] Stroke Program Early CT Score) - Ganglionic level infarction (caudate, lentiform nuclei, internal capsule, insula, M1-M3 cortex): 7 - Supraganglionic infarction (M4-M6 cortex): 3 Total score (0-10 with 10 being normal): 10 IMPRESSION: 1. Normal head CT. 2. ASPECTS is 10. Page placed by telephone at the time of interpretation on 03/28/2017 at 2:17 pm to Dr. Cristobal Goldmann. Electronically Signed   By: Nelson Chimes M.D.   On: 03/28/2017 14:22        Scheduled Meds: . ALPRAZolam  1 mg Oral TID  . amLODipine  10 mg Oral Daily  . aspirin EC  325 mg Oral Daily  . busPIRone  7.5 mg Oral TID  . cloNIDine  0.1 mg Oral BID  . enoxaparin (LOVENOX) injection  40 mg Subcutaneous Q24H  . escitalopram  10 mg Oral Daily  . famotidine  10 mg Oral Daily  . lisinopril  20 mg Oral BID   Or  . hydrochlorothiazide  12.5 mg Oral BID  . insulin aspart  0-5 Units Subcutaneous QHS  . insulin aspart  0-9 Units Subcutaneous TID WC  . insulin glargine  20 Units Subcutaneous BID  . linagliptin  5 mg Oral Daily  . nebivolol  10 mg Oral Daily  . nicotine  14 mg Transdermal Daily  . nitroGLYCERIN  0.5 inch Topical Q6H  . oxyCODONE  10 mg Oral TID  . phenytoin  100 mg Oral BID  . simvastatin  10 mg Oral q1800  . venlafaxine  225 mg Oral Daily   Continuous Infusions: . sodium chloride 75 mL/hr at 03/28/17 2329     LOS: 0 days    Time spent: 49 minutes    Derl Abalos, MD [facility] Pager [phone]  If 7PM-7AM, please contact night-coverage [email or web address] Password [email or web address] 03/29/2017, 1:35 PM

## 2017-03-29 NOTE — Progress Notes (Signed)
STROKE TEAM PROGRESS NOTE   HISTORY OF PRESENT ILLNESS (per record) Evan Mcdaniel is a 56 y.o. male with past medical history of prostate cancer with bony metastasis (spine), diabetes mellitus, hypertension, chronic pain syndrome, and seizures (takes phenytoin), who presented to the ED 03/28/2017 with complaints of severe substernal chest pain, that started at 11:30 a.m.  [place], was not doing any heavy activity or exertion.  Shortly thereafter, he developed  L sided arm and leg weakness and difficulty walking.  He reports being under increased stress this week due to attending sentencing of his daughter's murderer.  Cardiac workup negative for MI.  CT Head and MRI showed no acute abnormalities. CTA of Neck showed severe stenosis of the proximal right M2 segment.  On exam his reflexes are intact, but his range of motion is limited by diffuse upper back pain.  MRI brain negative for acute stroke.  MRI C-spine and MRI T-spine pending.  Date last known well: 03/28/17 Time last known well: 1130  Patient was not administered IV t-PA secondary to low NIHSS and increased risk for bleeding with bony metastasis of cancer. He was admitted General Neurology for further evaluation and treatment.   SUBJECTIVE (INTERVAL HISTORY) His family is at the bedside.  The patient is awake, alert, and follows all commands appropriately.His states his symptoms began with severe pain in his chest going to his back as well as involving the left arm and leg with weakness. He is doing better now but still has some pain.   OBJECTIVE Temp:  [97.7 F (36.5 C)-99 F (37.2 C)] 97.9 F (36.6 C) (06/27 0736) Pulse Rate:  [58-151] 151 (06/27 0736) Cardiac Rhythm: Normal sinus rhythm (06/27 0700) Resp:  [8-21] 8 (06/27 0736) BP: (137-173)/(89-117) 145/102 (06/27 0736) SpO2:  [95 %-100 %] 99 % (06/27 0736) Weight:  [90.2 kg (198 lb 12.8 oz)-90.4 kg (199 lb 3.2 oz)] 90.4 kg (199 lb 3.2 oz) (06/27  0440)  CBC:  Recent Labs Lab 03/28/17 1349 03/28/17 1411 03/29/17 0534  WBC 7.5  --  8.3  NEUTROABS 4.3  --  3.7  HGB 12.9* 14.3 12.0*  HCT 39.3 42.0 38.0*  MCV 86.9  --  89.4  PLT 188  --  326    Basic Metabolic Panel:  Recent Labs Lab 03/28/17 1349 03/28/17 1411 03/29/17 0534  NA 139 140 139  K 3.3* 3.3* 3.5  CL 104 100* 104  CO2 24  --  28  GLUCOSE 115* 110* 105*  BUN 10 12 11   CREATININE 0.89 0.80 0.86  CALCIUM 9.5  --  8.9  MG  --   --  1.8    Lipid Panel: No results found for: CHOL, TRIG, HDL, CHOLHDL, VLDL, LDLCALC HgbA1c: No results found for: HGBA1C Urine Drug Screen:    Component Value Date/Time   LABOPIA POSITIVE (A) 03/28/2017 1426   COCAINSCRNUR NONE DETECTED 03/28/2017 1426   LABBENZ NONE DETECTED 03/28/2017 1426   AMPHETMU NONE DETECTED 03/28/2017 1426   THCU POSITIVE (A) 03/28/2017 1426   LABBARB NONE DETECTED 03/28/2017 1426    Alcohol Level     Component Value Date/Time   ETH <5 03/28/2017 2002    IMAGING  Ct Angio Head W Or Wo Contrast 03/28/2017 IMPRESSION: 1. No emergent large vessel occlusion. 2. Severe stenosis of the proximal right M2 segment superior division, which remains patent distally. 3. Multifocal atherosclerotic irregularity of the intracranial arteries without hemodynamically significant stenosis of the major vessels other  than that described above. 4. Normal CTA of the neck. 5.  Aortic Atherosclerosis (ICD10-I70.0).  Dg Chest 2 View 03/28/2017 IMPRESSION: No acute cardiopulmonary process.    Mr Brain Wo Contrast 03/28/2017 IMPRESSION: No acute intracranial abnormality. Normal for age noncontrast MRI appearance of the brain.   Ct Head Code Stroke W/o Cm 03/28/2017 IMPRESSION: 1. Normal head CT. 2. ASPECTS is 10.  MRI C-spine  pending  MRI T-spine  pending    PHYSICAL EXAM Pleasant middle aged male not in distress. . Afebrile. Head is nontraumatic. Neck is supple without bruit.    Cardiac exam no murmur or  gallop. Lungs are clear to auscultation. Distal pulses are well felt. Neurological Exam ;  Awake  Alert oriented x 3. Normal speech and language.eye movements full without nystagmus.fundi were not visualized. Vision acuity and fields appear normal. Hearing is normal. Palatal movements are normal. Face symmetric. Tongue midline. Normal strength, tone, reflexes and coordination except for some giveaway weakness on the left mostly due to pain. Subjective diminished sensation on the left hemibody including forehead and splitting of the midline.. Normal sensation. Gait deferred.  ASSESSMENT/PLAN [name] is a 56 y.o. male with history of  prostate cancer with bony metastasis (spine), diabetes mellitus, hypertension, chronic pain syndrome, and seizures (takes phenytoin) presenting with chest pain, L sided arm and leg weakness and difficulty walking. He did not receive IV t-PA due to low NIHSS and increased risk for bleeding with bony metastasis of cancer.   Stroke like episode of unclear etiology Resultant  subjective left hemibody sensory loss and some giveaway weakness  CT head: no stroke  CT Angio: 1. No emergent large vessel occlusion. 2. Severe stenosis of the proximal right M2 segment superior division, which remains patent distally. 3. Multifocal atherosclerotic irregularity of the intracranial arteries without hemodynamically significant stenosis  MRI head: no stroke   MRI C-spine:  pending  MRI T-Spine:  pending  2D Echo: EF 60-65%. No source of embolus   LDL pending   HgbA1c pending   Lovenox 40 mg sq daily for VTE prophylaxis  Diet heart healthy/carb modified Room service appropriate? Yes; Fluid consistency: Thin  aspirin 81 mg daily prior to admission, now on aspirin 325 mg daily  Patient counseled to be compliant with his antithrombotic medications  Ongoing aggressive stroke risk factor management  Therapy recommendations:   pending  Disposition:    ending  Hypertension  Stable  Permissive hypertension (OK if < 220/120) but gradually normalize in 5-7 days  Long-term BP goal normotensive  Hyperlipidemia  Home meds: none  LDL pending , goal < 70  F/u LDL, and add atorvastatin 40 mg PO daily if not at goal  Continue statin at discharge  Diabetes  HgbA1c  ending, goal < 7.0  On lantus 20 units [place] BID at home   Controlled  Other Stroke Risk Factors  Advanced age  Cigarette smokeradvised to stop smoking, takes Chantix at home   Overweight, Body mass index is 27.78 kg/m., recommend weight loss, diet and exercise as appropriate  Prostate cancer with bony metastasis to spine  Other Active Problems  Chronic pain  Hospital day # 0  I have personally examined this patient, reviewed notes, independently viewed imaging studies, participated in medical decision making and plan of care.ROS completed by me personally and pertinent positives fully documented  I have made any additions or clarifications directly to the above note. He presented with sudden onset of chest, back and left body pain with  subjective weakness etiology is unclear. MRI scan of the brain shows no definite stroke or metastasis. Recommend checking MRI scan also likely thoracic spine for compressive spinal lesions. Long discussion with patient and family at the bedside as well as with Dr. Grandville Silos and answered questions. Greater than 50% time during this 35 minute visit was spent on counseling and coordination of care about his strokelike symptoms, bony metastasis and answering questions  Antony Contras, [place] Pager: [phone] 03/29/2017 4:21 PM   To contact Stroke Continuity provider, please refer to [email or web address] After hours, contact General Neurology

## 2017-03-29 NOTE — Progress Notes (Signed)
At 2258, Patient was c/o 9/10 mid sternal chest pain and was diaphoretic. BP was 147/94 HR 60s. EKG obtained. Patient put on 2L O2.  Patient given 1 nitro. At 2303, BP was 146/108, HR 80s. No relief from nitro. RN administered another nitro. At 2308, BP 153/104, HR 60s. Chest pain went down to 7/10.  Patient stated that he did not want the third nitro and that he wanted his scheduled dose of xanax. RN gave the dose of xanax. BP was 137/99 HR 70s. Patient was relaxing and stating the pain was going away. Will continue to monitor closely

## 2017-03-29 NOTE — Progress Notes (Signed)
  Echocardiogram 2D Echocardiogram has been performed.  Evan Mcdaniel 03/29/2017, 12:07 PM

## 2017-03-30 DIAGNOSIS — R200 Anesthesia of skin: Secondary | ICD-10-CM | POA: Diagnosis not present

## 2017-03-30 DIAGNOSIS — I10 Essential (primary) hypertension: Secondary | ICD-10-CM | POA: Diagnosis not present

## 2017-03-30 DIAGNOSIS — R0789 Other chest pain: Secondary | ICD-10-CM | POA: Diagnosis not present

## 2017-03-30 DIAGNOSIS — R531 Weakness: Secondary | ICD-10-CM

## 2017-03-30 DIAGNOSIS — G894 Chronic pain syndrome: Secondary | ICD-10-CM | POA: Diagnosis not present

## 2017-03-30 DIAGNOSIS — E119 Type 2 diabetes mellitus without complications: Secondary | ICD-10-CM | POA: Diagnosis not present

## 2017-03-30 LAB — CBC
HCT: 38 % — ABNORMAL LOW (ref 39.0–52.0)
HEMOGLOBIN: 12.1 g/dL — AB (ref 13.0–17.0)
MCH: 28.4 pg (ref 26.0–34.0)
MCHC: 31.8 g/dL (ref 30.0–36.0)
MCV: 89.2 fL (ref 78.0–100.0)
Platelets: 183 10*3/uL (ref 150–400)
RBC: 4.26 MIL/uL (ref 4.22–5.81)
RDW: 14.4 % (ref 11.5–15.5)
WBC: 7.2 10*3/uL (ref 4.0–10.5)

## 2017-03-30 LAB — MAGNESIUM: Magnesium: 2.1 mg/dL (ref 1.7–2.4)

## 2017-03-30 LAB — BASIC METABOLIC PANEL
Anion gap: 8 (ref 5–15)
BUN: 10 mg/dL (ref 6–20)
CHLORIDE: 103 mmol/L (ref 101–111)
CO2: 29 mmol/L (ref 22–32)
Calcium: 9.1 mg/dL (ref 8.9–10.3)
Creatinine, Ser: 0.79 mg/dL (ref 0.61–1.24)
GFR calc Af Amer: >60 mL/min (ref >60)
GFR calc non Af Amer: >60 mL/min (ref >60)
GLUCOSE: 106 mg/dL — AB (ref 65–99)
POTASSIUM: 4.2 mmol/L (ref 3.5–5.1)
Sodium: 140 mmol/L (ref 135–145)

## 2017-03-30 LAB — HEMOGLOBIN A1C
HEMOGLOBIN A1C: 6.2 % — AB (ref 4.8–5.6)
Mean Plasma Glucose: 131 mg/dL

## 2017-03-30 LAB — GLUCOSE, CAPILLARY
GLUCOSE-CAPILLARY: 120 mg/dL — AB (ref 65–99)
GLUCOSE-CAPILLARY: 112 mg/dL — AB (ref 65–99)
GLUCOSE-CAPILLARY: 72 mg/dL (ref 65–99)
Glucose-Capillary: 129 mg/dL — ABNORMAL HIGH (ref 65–99)

## 2017-03-30 MED ORDER — ALPRAZOLAM 1 MG PO TABS: 1.0000 mg | ORAL_TABLET | Freq: Three times a day (TID) | ORAL | 0 refills | Status: AC

## 2017-03-30 MED ORDER — OXYCODONE HCL 15 MG PO TABS: 15.0000 mg | ORAL_TABLET | Freq: Two times a day (BID) | ORAL | 0 refills | Status: AC | PRN

## 2017-03-30 MED ORDER — VENLAFAXINE HCL 75 MG PO TABS
75.0000 mg | ORAL_TABLET | Freq: Three times a day (TID) | ORAL | Status: DC
  Administered 2017-03-30 (×3): 75 mg via ORAL
  Filled 2017-03-30 (×4): qty 1

## 2017-03-30 MED ORDER — NICOTINE 14 MG/24HR TD PT24: 14.0000 mg | MEDICATED_PATCH | Freq: Every day | TRANSDERMAL | 0 refills | Status: DC

## 2017-03-30 MED ORDER — OXYCODONE HCL ER 10 MG PO T12A: 10.0000 mg | EXTENDED_RELEASE_TABLET | Freq: Three times a day (TID) | ORAL | 0 refills | Status: AC

## 2017-03-30 NOTE — Evaluation (Signed)
Physical Therapy Evaluation Patient Details Name: Evan Mcdaniel MRN: [id number] DOB: October 19, 1960 Today's Date: 03/30/2017   History of Present Illness  Pt adm with chest pain and lt arm numbness. Cardiac work up negative. Brain MRI negative. Cervical and thoracic MRI showed some moderate and severe foraminal narrowing in cervical spine. PMH - DM, PTSD, chronic pain, HTN, prostate CA, depression/anxiety.  Clinical Impression  Pt doing well with mobility and no further PT needed.  Ready for dc from PT standpoint. Pt reports 8/10 pain in LUE and LLE. Pt conversing pleasantly and without signs of distress throughout.      Follow Up Recommendations No PT follow up    Equipment Recommendations  None recommended by PT    Recommendations for Other Services       Precautions / Restrictions Precautions Precautions: None Restrictions Weight Bearing Restrictions: No      Mobility  Bed Mobility Overal bed mobility: Independent                Transfers Overall transfer level: Independent                  Ambulation/Gait Ambulation/Gait assistance: Independent Ambulation Distance (Feet): 550 Feet Assistive device: None Gait Pattern/deviations: Step-through pattern;Decreased stance time - left   Gait velocity interpretation: at or above normal speed for age/gender General Gait Details: Steady gait with good balance. Pt reports at baseline he uses cane due to back and LLE pain. VSS with HR 80 with amb  Stairs            Wheelchair Mobility    Modified Rankin (Stroke Patients Only)       Balance Overall balance assessment: Independent                                           Pertinent Vitals/Pain Pain Assessment: Faces Pain Score: 8  (Pt conversing pleasantly without signs of distress) Pain Location: LUE and LLE Pain Descriptors / Indicators: Numbness;Aching Pain Intervention(s): Limited activity within patient's tolerance    Home  Living Family/patient expects to be discharged to:: Private residence Living Arrangements: Spouse/significant other   Type of Home: House Home Access: Stairs to enter Entrance Stairs-Rails: Psychiatric nurse of Steps: 2 Home Layout: One level Home Equipment: Cane - single point      Prior Function Level of Independence: Independent with assistive device(s)         Comments: Pt uses cane due to chronic back and left leg pain.     Hand Dominance   Dominant Hand: Right    Extremity/Trunk Assessment   Upper Extremity Assessment Upper Extremity Assessment: LUE deficits/detail;Overall WFL for tasks assessed LUE Sensation:  (pt reports numbness)    Lower Extremity Assessment Lower Extremity Assessment: Overall WFL for tasks assessed;LLE deficits/detail LLE Sensation:  (Pt reports numbness)       Communication   Communication: No difficulties  Cognition Arousal/Alertness: Awake/alert Behavior During Therapy: WFL for tasks assessed/performed Overall Cognitive Status: Within Functional Limits for tasks assessed                                        General Comments      Exercises     Assessment/Plan    PT Assessment Patent does not need any further PT services  PT Problem List         PT Treatment Interventions      PT Goals (Current goals can be found in the Care Plan section)  Acute Rehab PT Goals PT Goal Formulation: All assessment and education complete, DC therapy    Frequency     Barriers to discharge        Co-evaluation               AM-PAC PT "6 Clicks" Daily Activity  Outcome Measure Difficulty turning over in bed (including adjusting bedclothes, sheets and blankets)?: None Difficulty moving from lying on back to sitting on the side of the bed? : None Difficulty sitting down on and standing up from a chair with arms (e.g., wheelchair, bedside commode, etc,.)?: None Help needed moving to and from a bed  to chair (including a wheelchair)?: None Help needed walking in hospital room?: None Help needed climbing 3-5 steps with a railing? : None 6 Click Score: 24    End of Session   Activity Tolerance: Patient tolerated treatment well Patient left: Other (comment) (in bathroom) Nurse Communication: Mobility status PT Visit Diagnosis: Difficulty in walking, not elsewhere classified (R26.2)    Time: 5809-9833 PT Time Calculation (min) (ACUTE ONLY): 12 min   Charges:   PT Evaluation $PT Eval Low Complexity: 1 Procedure     PT G Codes:   PT G-Codes **NOT FOR INPATIENT CLASS** Functional Assessment Tool Used: AM-PAC 6 Clicks Basic Mobility Functional Limitation: Mobility: Walking and moving around Mobility: Walking and Moving Around Current Status (A2505): 0 percent impaired, limited or restricted Mobility: Walking and Moving Around Goal Status (L9767): 0 percent impaired, limited or restricted Mobility: Walking and Moving Around Discharge Status (H4193): 0 percent impaired, limited or restricted    [facility] PT [place] 03/30/2017, 10:08 AM

## 2017-03-30 NOTE — Progress Notes (Signed)
STROKE TEAM PROGRESS NOTE   HISTORY OF PRESENT ILLNESS (per record) Evan Mcdaniel is a 56 y.o. male with past medical history of prostate cancer with bony metastasis (spine), diabetes mellitus, hypertension, chronic pain syndrome, and seizures (takes phenytoin), who presented to the ED 03/28/2017 with complaints of severe substernal chest pain, that started at 11:30 a.m.  [place], was not doing any heavy activity or exertion.  Shortly thereafter, he developed  L sided arm and leg weakness and difficulty walking.  He reports being under increased stress this week due to attending sentencing of his daughter's murderer.  Cardiac workup negative for MI.  CT Head and MRI showed no acute abnormalities. CTA of Neck showed severe stenosis of the proximal right M2 segment.  On exam his reflexes are intact, but his range of motion is limited by diffuse upper back pain.  MRI brain negative for acute stroke.  MRI C-spine and MRI T-spine pending.  Date last known well: 03/28/17 Time last known well: 1130  Patient was not administered IV t-PA secondary to low NIHSS and increased risk for bleeding with bony metastasis of cancer. He was admitted General Neurology for further evaluation and treatment.   SUBJECTIVE (INTERVAL HISTORY) His family is at the bedside.  The patient is awake, alert, and follows all commands appropriately.he states his pain is much less and weakness has improved. MRI cervical and thoraxic spine shows no metastasis or compression..   OBJECTIVE Temp:  [97.6 F (36.4 C)-98.7 F (37.1 C)] 97.6 F (36.4 C) (06/28 0508) Pulse Rate:  [52-62] 52 (06/28 0508) Cardiac Rhythm: Normal sinus rhythm (06/28 1000) Resp:  [16-19] 19 (06/27 1951) BP: (118-166)/(70-131) 124/70 (06/28 0508) SpO2:  [98 %-100 %] 98 % (06/28 0508) Weight:  [203 lb 9.6 oz (92.4 kg)] 203 lb 9.6 oz (92.4 kg) (06/28 0508)  CBC:  Recent Labs Lab 03/28/17 1349  03/29/17 0534 03/30/17 0527  WBC 7.5   --  8.3 7.2  NEUTROABS 4.3  --  3.7  --   HGB 12.9*  < > 12.0* 12.1*  HCT 39.3  < > 38.0* 38.0*  MCV 86.9  --  89.4 89.2  PLT 188  --  176 183  < > = values in this interval not displayed.  Basic Metabolic Panel:   Recent Labs Lab 03/29/17 0534 03/30/17 0527  NA 139 140  K 3.5 4.2  CL 104 103  CO2 28 29  GLUCOSE 105* 106*  BUN 11 10  CREATININE 0.86 0.79  CALCIUM 8.9 9.1  MG 1.8 2.1    Lipid Panel: No results found for: CHOL, TRIG, HDL, CHOLHDL, VLDL, LDLCALC HgbA1c:  Lab Results  Component Value Date   HGBA1C 6.2 (H) 03/28/2017   Urine Drug Screen:     Component Value Date/Time   LABOPIA POSITIVE (A) 03/28/2017 1426   COCAINSCRNUR NONE DETECTED 03/28/2017 1426   LABBENZ NONE DETECTED 03/28/2017 1426   AMPHETMU NONE DETECTED 03/28/2017 1426   THCU POSITIVE (A) 03/28/2017 1426   LABBARB NONE DETECTED 03/28/2017 1426    Alcohol Level     Component Value Date/Time   ETH <5 03/28/2017 2002    IMAGING  Ct Angio Head W Or Wo Contrast 03/28/2017 IMPRESSION: 1. No emergent large vessel occlusion. 2. Severe stenosis of the proximal right M2 segment superior division, which remains patent distally. 3. Multifocal atherosclerotic irregularity of the intracranial arteries without hemodynamically significant stenosis of the major vessels other than that described above. 4. Normal  CTA of the neck. 5.  Aortic Atherosclerosis (ICD10-I70.0).  Dg Chest 2 View 03/28/2017 IMPRESSION: No acute cardiopulmonary process.    Mr Brain Wo Contrast 03/28/2017 IMPRESSION: No acute intracranial abnormality. Normal for age noncontrast MRI appearance of the brain.   Ct Head Code Stroke W/o Cm 03/28/2017 IMPRESSION: 1. Normal head CT. 2. ASPECTS is 10.  MRI C-spine 1. No bony metastatic disease identified. No abnormal cord signal or enhancement. 2. Mild C4-5 multifactorial canal stenosis. No high-grade canal stenosis. 3. Multilevel mild foraminal narrowing with severe left C2-3  and moderate bilateral C5-6 foraminal narrowing.  MRI T-spine 1. No bony metastatic disease identified. No abnormal cord signal or enhancement. 2. Mild thoracic spine spondylosis. No significant foraminal narrowing or canal stenosis    PHYSICAL EXAM Pleasant middle aged male not in distress. . Afebrile. Head is nontraumatic. Neck is supple without bruit.    Cardiac exam no murmur or gallop. Lungs are clear to auscultation. Distal pulses are well felt. Neurological Exam ;  Awake  Alert oriented x 3. Normal speech and language.eye movements full without nystagmus.fundi were not visualized. Vision acuity and fields appear normal. Hearing is normal. Palatal movements are normal. Face symmetric. Tongue midline. Normal strength, tone, reflexes and coordination except for some giveaway weakness on the left much improved today.  .. Normal sensation. Gait deferred.  ASSESSMENT/PLAN [name] is a 56 y.o. male with history of  prostate cancer with bony metastasis (spine), diabetes mellitus, hypertension, chronic pain syndrome, and seizures (takes phenytoin) presenting with chest pain, L sided arm and leg weakness and difficulty walking. He did not receive IV t-PA due to low NIHSS and increased risk for bleeding with bony metastasis of cancer.   Stroke like episode of unclear etiology Resultant  subjective left hemibody sensory loss and some giveaway weakness now improved  CT head: no stroke  CT Angio: 1. No emergent large vessel occlusion. 2. Severe stenosis of the proximal right M2 segment superior division, which remains patent distally. 3. Multifocal atherosclerotic irregularity of the intracranial arteries without hemodynamically significant stenosis  MRI head: no stroke   MRI C-spine:  pending  MRI T-Spine:  pending  2D Echo: EF 60-65%. No source of embolus   LDL pending   HgbA1c pending   Lovenox 40 mg sq daily for VTE prophylaxis Diet heart healthy/carb modified Room  service appropriate? Yes; Fluid consistency: Thin  aspirin 81 mg daily prior to admission, now on aspirin 325 mg daily  Patient counseled to be compliant with his antithrombotic medications  Ongoing aggressive stroke risk factor management  Therapy recommendations:   pending  Disposition:   ending  Hypertension  Stable  Permissive hypertension (OK if < 220/120) but gradually normalize in 5-7 days  Long-term BP goal normotensive  Hyperlipidemia  Home meds: none  LDL pending , goal < 70  F/u LDL, and add atorvastatin 40 mg PO daily if not at goal  Continue statin at discharge  Diabetes  HgbA1c  ending, goal < 7.0  On lantus 20 units [place] BID at home   Controlled  Other Stroke Risk Factors  Advanced age  Cigarette smokeradvised to stop smoking, takes Chantix at home   Overweight, Body mass index is 28.4 kg/m., recommend weight loss, diet and exercise as appropriate  Prostate cancer with bony metastasis to spine  Other Active Problems  Chronic pain  Hospital day # 0  I have personally examined this patient, reviewed notes, independently viewed imaging studies, participated in medical decision making  and plan of care.ROS completed by me personally and pertinent positives fully documented  I have made any additions or clarifications directly to the above note. He presented with sudden onset of chest, back and left body pain with subjective weakness etiology is unclear. MRI scan of the brain and spine shows no definite stroke or metastasis. No further neurological work up of follow is necessary at this time. Stroke team will sign off. Call for questions Long discussion with patient and family at the bedside as well as with Dr. Grandville Silos and answered questions.   Antony Contras, MD Medical Director [place] Pager: [phone] 03/30/2017 12:50 PM   To contact Stroke Continuity provider, please refer to [email or web address] After hours, contact General Neurology

## 2017-03-30 NOTE — Discharge Summary (Signed)
Physician Discharge Summary  Evan Mcdaniel HER:[id number] DOB: Sep 07, 1961 DOA: 03/28/2017  PCP: [name], MD  Admit date: 03/28/2017 Discharge date: 03/30/2017  Time spent: 60 minutes  Recommendations for Outpatient Follow-up:  1. Follow-up with [name], MD in 1-2 weeks. On follow-up patient will need a basic metabolic profile done to follow-up on electrolytes and renal function. Patient's chronic pain medications will need to be reassessed. Patient was given a 5 day supply of his chronic pain medications as well as Xanax until he was able to follow-up with his PCP or M.D. managing his pain.   Discharge Diagnoses:  Principal Problem:   Atypical chest pain Active Problems:   Accelerated hypertension   Diabetes mellitus without complication (HCC)   Chronic pain   Cancer (HCC)   Left sided numbness   Marijuana use   Left-sided weakness   Discharge Condition: Stable.  Diet recommendation: Carb modified  Filed Weights   03/28/17 2006 03/29/17 0440 03/30/17 0508  Weight: 90.2 kg (198 lb 12.8 oz) 90.4 kg (199 lb 3.2 oz) 92.4 kg (203 lb 9.6 oz)    History of present illness:  Evan Mcdaniel is a 56 y.o. male with Past medical history of type 2 diabetes mellitus, hypertension, prostate cancer, chronic pain syndrome, anxiety as well as depression. The patient presented with complaints of severe substernal chest pain, started at 11:30. [place], was not doing any heavy activity or exertion. This chest pain was substernal in location, 10 out of 10 in intensity, pressure-like, associated with shortness of breath as well as nausea and vomiting. No fever no chills. Did not have any diarrhea or constipation. Denied having any recent travel or immobilization, no leg swelling or leg pain. Denied having any similar chest pain in recent past but mentions that he had an episode in 3 months ago. Patient was actually admitted  at [name] underwent cardiac catheterization which was showing normal coronaries in December 2017.  On admission chest pain was still 8 out of 10 but no nausea vomiting or shortness of breath no dizziness. Patient also initially complained about having some left-sided numbness which he thinks was associated with chest pain and has currently resolved. No other focal deficit reported.  ED Course: Presented with complaints of chest pain, also had some left-sided numbness. Code stroke was called. Neurology assessed the patient. MRI negative. Patient was admitted for further workup for chest pain.  Hospital Course:  #1 atypical chest pain Present presented with atypical chest pain however due to multiple risk factors of hypertension, hyperlipidemia, hypertensive urgency, tobacco abuse patient was admitted for further workup. Patient noted to have had a left heart catheterization done at [facility] December 2017 that had no coronary artery disease. EKG done with no ischemic changes. Cardiac enzymes negative 3. Patient noted to still elevated blood pressure on home regimen of hydrochlorothiazide and lisinopril resumed. 2-D echo done with EF of 60-65%, no wall motion abnormalities. Grade 1 diastolic dysfunction. D-dimer was negative. Patient was seen in consultation by cardiology and recommended no further workup. Patient's chest pain noted likely to be stress-related and secondary to PTSD. Outpatient follow-up.  #2 accelerated hypertension Patient maintained on home regimen of Norvasc, clonidine, bystolic, Prinzide. Patient blood pressure improved and was 133/64 on day of discharge.  #3 left arm numbness MRI HEAD AND CT head negative for acute CVA. MRI of the C andT spine ordered Evan neurology. Neurology reviewed imaging studies and felt  patient's left body pain and subjective weakness etiology was unclear. It was noted that MRI scan of the brain and spine showed no definite stroke or  metastases. It was felt Evan neurology that no further neurological workup was needed at this time and patient may follow-up with his PCP in the outpatient setting.   #4 well-controlled diabetes mellitus Patient noted to have a hemoglobin A1c of 6.2. Since blood sugars were well-controlled during the hospitalization arranged from 94 -129. Patient's oral hypoglycemic agents were held and patient was maintained on Lantus and sliding scale insulin during the hospitalization. Outpatient follow-up.  #5 hyperlipidemia Continued on a statin.  #6 PTSD Follow-up at the [place].  #7 chronic pain syndrome Patient was maintained on his home regimen of OxyContin, oxycodone. Patient was given a 5 day supply of OxyContin and oxycodone until he was able to follow-up with his M.D. who was prescribing his pain regimen.  #8 depression/anxiety Continued on home regimen of BuSpar, Xanax, Lexapro. Patient given a 5 day supply of xanax on discharge, until he is able to follow-up with his PCP.  #9 tobacco abuse   tobacco cessation. Nicotine patch placed. Outpatient follow-up..    Procedures:  MRI brain 03/28/2017  CT head 03/28/2017  2-D echo 03/29/2017 pending  Chest x-ray 03/28/2017  CT angiogram head and neck 03/28/2017  MRI C and T-spine 03/29/2017   Consultations:  Cardiology: [name] 03/29/2017  Neurology: [name] 03/28/2017   Discharge Exam: Vitals:   03/30/17 0508 03/30/17 1502  BP: 124/70 133/64  Pulse: (!) 52 63  Resp:  19  Temp: 97.6 F (36.4 C) 97.9 F (36.6 C)    General: NAD Cardiovascular: RRR Respiratory: CTAB  Discharge Instructions   Discharge Instructions    Diet Carb Modified    Complete by:  As directed    Increase activity slowly    Complete by:  As directed      Current Discharge Medication List    START taking these medications   Details  nicotine (NICODERM CQ - DOSED IN MG/24 HOURS) 14 mg/24hr patch Place 1 patch (14 mg total)  onto the skin daily. Qty: 28 patch, Refills: 0      CONTINUE these medications which have CHANGED   Details  ALPRAZolam (XANAX) 1 MG tablet Take 1 tablet (1 mg total) by mouth 3 (three) times daily. Qty: 15 tablet, Refills: 0    oxyCODONE (OXYCONTIN) 10 mg 12 hr tablet Take 1 tablet (10 mg total) by mouth 3 (three) times daily. Qty: 14 tablet, Refills: 0    oxyCODONE (ROXICODONE) 15 MG immediate release tablet Take 1 tablet (15 mg total) by mouth 2 (two) times daily as needed for pain. Qty: 10 tablet, Refills: 0      CONTINUE these medications which have NOT CHANGED   Details  albuterol (PROVENTIL HFA;VENTOLIN HFA) 108 (90 Base) MCG/ACT inhaler Inhale 2 puffs into the lungs every 6 (six) hours as needed for wheezing or shortness of breath.    amLODipine (NORVASC) 10 MG tablet Take 1 tablet (10 mg total) by mouth daily. Qty: 20 tablet, Refills: 0    aspirin EC 81 MG tablet Take 81 mg by mouth daily.     busPIRone (BUSPAR) 7.5 MG tablet Take 7.5 mg by mouth 3 (three) times daily. Refills: 0    BYSTOLIC 10 MG tablet Take 10 mg by mouth daily. Refills: 0    CHANTIX STARTING MONTH PAK 0.5 MG X 11 & 1 MG X 42 tablet See admin  instructions. Refills: 0    cloNIDine (CATAPRES) 0.1 MG tablet Take 0.1 mg by mouth 2 (two) times daily as needed. Refills: 1    diphenhydrAMINE (BENADRYL) 25 MG tablet Take 50 mg by mouth every 6 (six) hours as needed for allergies.    escitalopram (LEXAPRO) 10 MG tablet Take 10 mg by mouth daily.    famotidine (PEPCID) 10 MG tablet Take 10 mg by mouth daily. Refills: 0    LANTUS SOLOSTAR 100 UNIT/ML Solostar Pen Inject 20 Units into the skin 2 (two) times daily.  Refills: 0    lisinopril-hydrochlorothiazide (PRINZIDE,ZESTORETIC) 20-12.5 MG tablet Take 1 tablet by mouth 2 (two) times daily.    metFORMIN (GLUCOPHAGE) 1000 MG tablet Take 1 tablet (1,000 mg total) by mouth 2 (two) times daily with a meal. Qty: 20 tablet, Refills: 0    nitroGLYCERIN  (NITROSTAT) 0.4 MG SL tablet Place 0.4 mg under the tongue every 5 (five) minutes as needed for chest pain.    phenytoin (DILANTIN) 100 MG ER capsule Take 100 mg by mouth 2 (two) times daily.     simvastatin (ZOCOR) 10 MG tablet Take 10 mg by mouth daily at 6 PM.    sitaGLIPtin (JANUVIA) 50 MG tablet Take 50 mg by mouth daily.    venlafaxine (EFFEXOR) 75 MG tablet Take 3 tablets (225 mg total) by mouth daily. Qty: 20 tablet, Refills: 0    Vitamin D, Ergocalciferol, (DRISDOL) 50000 units CAPS capsule Take 50,000 Units by mouth every Monday.      STOP taking these medications     metoprolol succinate (TOPROL-XL) 100 MG 24 hr tablet        Allergies  Allergen Reactions  . Naproxen Hives  . Vicodin [Hydrocodone-Acetaminophen] Hives   Follow-up Information    Sanchez-Brugal, Myer Peer, MD. Schedule an appointment as soon as possible for a visit in 2 week(s).   Specialty:  Internal Medicine Why:  f/u in 1-2 weeks. Contact information: [address] [place] [address] [phone]            The results of significant diagnostics from this hospitalization (including imaging, microbiology, ancillary and laboratory) are listed below for reference.    Significant Diagnostic Studies: Ct Angio Head W Or Wo Contrast  Result Date: 03/28/2017 CLINICAL DATA:  Left arm weakness EXAM: CT ANGIOGRAPHY HEAD AND NECK TECHNIQUE: Multidetector CT imaging of the head and neck was performed using the standard protocol during bolus administration of intravenous contrast. Multiplanar CT image reconstructions and MIPs were obtained to evaluate the vascular anatomy. Carotid stenosis measurements (when applicable) are obtained utilizing NASCET criteria, using the distal internal carotid diameter as the denominator. CONTRAST:  50 mL Isovue 370 COMPARISON:  Head CT 03/28/2017 FINDINGS: CTA NECK FINDINGS Aortic arch: There is no aneurysm or dissection of the visualized ascending aorta or aortic arch.  There is a normal 3 vessel branching pattern. The visualized proximal subclavian arteries are normal. There is calcific aortic atherosclerosis. Right carotid system: The right common carotid origin is widely patent. There is no common carotid or internal carotid artery dissection or aneurysm. No hemodynamically significant stenosis. Left carotid system: The left common carotid origin is widely patent. There is no common carotid or internal carotid artery dissection or aneurysm. No hemodynamically significant stenosis. Vertebral arteries: The vertebral system is right dominant. Both vertebral artery origins are normal. The left vertebral artery is congenitally hypoplastic and terminates in the left posterior inferior cerebellar artery. Skeleton: There is no bony spinal canal stenosis.  No lytic or blastic lesions. Other neck: The nasopharynx is clear. The oropharynx and hypopharynx are normal. The epiglottis is normal. The supraglottic larynx, glottis and subglottic larynx are normal. No retropharyngeal collection. The parapharyngeal spaces are preserved. The parotid and submandibular glands are normal. No sialolithiasis or salivary ductal dilatation. The thyroid gland is normal. There is no cervical lymphadenopathy. Upper chest: No pneumothorax or pleural effusion. No nodules or masses. Review of the MIP images confirms the above findings CTA HEAD FINDINGS Anterior circulation: --Intracranial internal carotid arteries: No hemodynamically significant stenosis. --Anterior cerebral arteries: There is approximately 50% atherosclerotic narrowing of the right A1 segment. Otherwise normal. --Middle cerebral arteries: There is severe stenosis at the origin of the right M2 segment superior division, which remains patent. There is atherosclerotic irregularity of the M1 segments, but no hemodynamically significant stenosis. --Posterior communicating arteries: Present on the left. Posterior circulation: --Posterior cerebral  arteries: Normal. --Superior cerebellar arteries: Normal. --Basilar artery: Normal. --Anterior inferior cerebellar arteries: Normal. --Posterior inferior cerebellar arteries: Normal. Venous sinuses: As permitted by contrast timing, patent. Anatomic variants: None Delayed phase: Not performed Review of the MIP images confirms the above findings IMPRESSION: 1. No emergent large vessel occlusion. 2. Severe stenosis of the proximal right M2 segment superior division, which remains patent distally. 3. Multifocal atherosclerotic irregularity of the intracranial arteries without hemodynamically significant stenosis of the major vessels other than that described above. 4. Normal CTA of the neck. 5.  Aortic Atherosclerosis (ICD10-I70.0). Electronically Signed   By: Ulyses Jarred M.D.   On: 03/28/2017 14:57   Dg Chest 2 View  Result Date: 03/28/2017 CLINICAL DATA:  Patient with mid chest pain. Left arm and hand pain. Left hand numbness. EXAM: CHEST  2 VIEW COMPARISON:  Chest radiograph 01/15/2017. FINDINGS: Monitoring leads overlie the patient. Stable cardiac and mediastinal contours. Tortuosity of the thoracic aorta. Low lung volumes. No consolidative pulmonary opacities. No pleural effusion or pneumothorax. Mid thoracic spine degenerative changes. IMPRESSION: No acute cardiopulmonary process. Electronically Signed   By: Lovey Newcomer M.D.   On: 03/28/2017 17:11   Ct Angio Neck W Or Wo Contrast  Result Date: 03/28/2017 CLINICAL DATA:  Left arm weakness EXAM: CT ANGIOGRAPHY HEAD AND NECK TECHNIQUE: Multidetector CT imaging of the head and neck was performed using the standard protocol during bolus administration of intravenous contrast. Multiplanar CT image reconstructions and MIPs were obtained to evaluate the vascular anatomy. Carotid stenosis measurements (when applicable) are obtained utilizing NASCET criteria, using the distal internal carotid diameter as the denominator. CONTRAST:  50 mL Isovue 370 COMPARISON:   Head CT 03/28/2017 FINDINGS: CTA NECK FINDINGS Aortic arch: There is no aneurysm or dissection of the visualized ascending aorta or aortic arch. There is a normal 3 vessel branching pattern. The visualized proximal subclavian arteries are normal. There is calcific aortic atherosclerosis. Right carotid system: The right common carotid origin is widely patent. There is no common carotid or internal carotid artery dissection or aneurysm. No hemodynamically significant stenosis. Left carotid system: The left common carotid origin is widely patent. There is no common carotid or internal carotid artery dissection or aneurysm. No hemodynamically significant stenosis. Vertebral arteries: The vertebral system is right dominant. Both vertebral artery origins are normal. The left vertebral artery is congenitally hypoplastic and terminates in the left posterior inferior cerebellar artery. Skeleton: There is no bony spinal canal stenosis. No lytic or blastic lesions. Other neck: The nasopharynx is clear. The oropharynx and hypopharynx are normal. The epiglottis is normal. The supraglottic larynx,  glottis and subglottic larynx are normal. No retropharyngeal collection. The parapharyngeal spaces are preserved. The parotid and submandibular glands are normal. No sialolithiasis or salivary ductal dilatation. The thyroid gland is normal. There is no cervical lymphadenopathy. Upper chest: No pneumothorax or pleural effusion. No nodules or masses. Review of the MIP images confirms the above findings CTA HEAD FINDINGS Anterior circulation: --Intracranial internal carotid arteries: No hemodynamically significant stenosis. --Anterior cerebral arteries: There is approximately 50% atherosclerotic narrowing of the right A1 segment. Otherwise normal. --Middle cerebral arteries: There is severe stenosis at the origin of the right M2 segment superior division, which remains patent. There is atherosclerotic irregularity of the M1 segments, but  no hemodynamically significant stenosis. --Posterior communicating arteries: Present on the left. Posterior circulation: --Posterior cerebral arteries: Normal. --Superior cerebellar arteries: Normal. --Basilar artery: Normal. --Anterior inferior cerebellar arteries: Normal. --Posterior inferior cerebellar arteries: Normal. Venous sinuses: As permitted by contrast timing, patent. Anatomic variants: None Delayed phase: Not performed Review of the MIP images confirms the above findings IMPRESSION: 1. No emergent large vessel occlusion. 2. Severe stenosis of the proximal right M2 segment superior division, which remains patent distally. 3. Multifocal atherosclerotic irregularity of the intracranial arteries without hemodynamically significant stenosis of the major vessels other than that described above. 4. Normal CTA of the neck. 5.  Aortic Atherosclerosis (ICD10-I70.0). Electronically Signed   By: Ulyses Jarred M.D.   On: 03/28/2017 14:57   Mr Brain Wo Contrast  Result Date: 03/28/2017 CLINICAL DATA:  56 year old male code stroke presentation. Left arm weakness. Head and neck CTA remarkable for right MCA M2 stenosis. EXAM: MRI HEAD WITHOUT CONTRAST TECHNIQUE: Multiplanar, multiecho pulse sequences of the brain and surrounding structures were obtained without intravenous contrast. COMPARISON:  CTA head and neck 1431 hours today. Head CT without contrast 1415 hours. FINDINGS: Brain: No restricted diffusion to suggest acute infarction. No midline shift, mass effect, evidence of mass lesion, ventriculomegaly, extra-axial collection or acute intracranial hemorrhage. Cervicomedullary junction and pituitary are within normal limits. Pearline Cables and white matter signal is within normal limits for age throughout the brain. No cortical encephalomalacia or chronic cerebral blood products identified. Vascular: Major intracranial vascular flow voids are preserved, the distal right vertebral artery is dominant. Skull and upper  cervical spine: Negative. Normal bone marrow signal. Sinuses/Orbits: Normal orbits soft tissues. Mild right maxillary sinus mucosal thickening, other Visualized paranasal sinuses and mastoids are stable and well pneumatized. Other: Visible internal auditory structures appear normal. Negative scalp and orbits soft tissues. IMPRESSION: No acute intracranial abnormality. Normal for age noncontrast MRI appearance of the brain. Electronically Signed   By: Genevie Ann M.D.   On: 03/28/2017 15:49   Mr Cervical Spine W Wo Contrast  Result Date: 03/30/2017 CLINICAL DATA:  56 y/o M; prostate cancer with left upper extremity weakness and numbness. EXAM: MRI CERVICAL AND THORACIC SPINE WITHOUT AND WITH CONTRAST TECHNIQUE: Multiplanar and multiecho pulse sequences of the cervical spine, to include the craniocervical junction and cervicothoracic junction, and thoracic spine, were obtained without and with intravenous contrast. CONTRAST:  64mL MULTIHANCE GADOBENATE DIMEGLUMINE 529 MG/ML IV SOLN COMPARISON:  None. FINDINGS: MRI CERVICAL SPINE FINDINGS Alignment: Straightening of cervical lordosis.  No listhesis. Vertebrae: No fracture, evidence of discitis, or bone lesion. No abnormal enhancement. Cord: Normal signal and morphology.  No abnormal enhancement. Posterior Fossa, vertebral arteries, paraspinal tissues: Negative. Disc levels: C2-3: Left-greater-than-right uncovertebral and facet hypertrophy with mild right and severe left foraminal narrowing. No significant canal stenosis. C3-4: Left-sided uncovertebral and facet hypertrophy and  small disc bulge with mild left-sided foraminal narrowing. Ventral thecal sac effacement and mild canal stenosis. C4-5: Bilateral uncovertebral and facet hypertrophy with mild bilateral foraminal narrowing. No significant canal stenosis. C5-6: Bilateral uncovertebral and facet hypertrophy with moderate bilateral foraminal narrowing. No significant canal stenosis. C6-7: Left-sided uncovertebral  and facet hypertrophy with mild left foraminal narrowing. No significant canal stenosis. C7-T1: No significant disc displacement, foraminal narrowing, or canal stenosis. MRI THORACIC SPINE FINDINGS Alignment: Physiologic. Vertebrae: No fracture, evidence of discitis, or bone lesion. No abnormal enhancement. Minimal superior endplate degenerative edema at T8-T9. Cord: Normal signal and morphology.  No abnormal enhancement. Posterior Fossa, vertebral arteries, paraspinal tissues: Negative. Disc levels: Mild multilevel discogenic degenerative changes with disc desiccation and multilevel mild disc space narrowing. At T8-9 there is a tiny central disc protrusion and annular fissure. No significant foraminal narrowing or canal stenosis. IMPRESSION: Cervical spine: 1. No bony metastatic disease identified. No abnormal cord signal or enhancement. 2. Mild C4-5 multifactorial canal stenosis. No high-grade canal stenosis. 3. Multilevel mild foraminal narrowing with severe left C2-3 and moderate bilateral C5-6 foraminal narrowing. Thoracic spine: 1. No bony metastatic disease identified. No abnormal cord signal or enhancement. 2. Mild thoracic spine spondylosis. No significant foraminal narrowing or canal stenosis. Electronically Signed   By: Kristine Garbe M.D.   On: 03/30/2017 00:12   Mr Thoracic Spine W Wo Contrast  Result Date: 03/30/2017 CLINICAL DATA:  56 y/o M; prostate cancer with left upper extremity weakness and numbness. EXAM: MRI CERVICAL AND THORACIC SPINE WITHOUT AND WITH CONTRAST TECHNIQUE: Multiplanar and multiecho pulse sequences of the cervical spine, to include the craniocervical junction and cervicothoracic junction, and thoracic spine, were obtained without and with intravenous contrast. CONTRAST:  62mL MULTIHANCE GADOBENATE DIMEGLUMINE 529 MG/ML IV SOLN COMPARISON:  None. FINDINGS: MRI CERVICAL SPINE FINDINGS Alignment: Straightening of cervical lordosis.  No listhesis. Vertebrae: No  fracture, evidence of discitis, or bone lesion. No abnormal enhancement. Cord: Normal signal and morphology.  No abnormal enhancement. Posterior Fossa, vertebral arteries, paraspinal tissues: Negative. Disc levels: C2-3: Left-greater-than-right uncovertebral and facet hypertrophy with mild right and severe left foraminal narrowing. No significant canal stenosis. C3-4: Left-sided uncovertebral and facet hypertrophy and small disc bulge with mild left-sided foraminal narrowing. Ventral thecal sac effacement and mild canal stenosis. C4-5: Bilateral uncovertebral and facet hypertrophy with mild bilateral foraminal narrowing. No significant canal stenosis. C5-6: Bilateral uncovertebral and facet hypertrophy with moderate bilateral foraminal narrowing. No significant canal stenosis. C6-7: Left-sided uncovertebral and facet hypertrophy with mild left foraminal narrowing. No significant canal stenosis. C7-T1: No significant disc displacement, foraminal narrowing, or canal stenosis. MRI THORACIC SPINE FINDINGS Alignment: Physiologic. Vertebrae: No fracture, evidence of discitis, or bone lesion. No abnormal enhancement. Minimal superior endplate degenerative edema at T8-T9. Cord: Normal signal and morphology.  No abnormal enhancement. Posterior Fossa, vertebral arteries, paraspinal tissues: Negative. Disc levels: Mild multilevel discogenic degenerative changes with disc desiccation and multilevel mild disc space narrowing. At T8-9 there is a tiny central disc protrusion and annular fissure. No significant foraminal narrowing or canal stenosis. IMPRESSION: Cervical spine: 1. No bony metastatic disease identified. No abnormal cord signal or enhancement. 2. Mild C4-5 multifactorial canal stenosis. No high-grade canal stenosis. 3. Multilevel mild foraminal narrowing with severe left C2-3 and moderate bilateral C5-6 foraminal narrowing. Thoracic spine: 1. No bony metastatic disease identified. No abnormal cord signal or  enhancement. 2. Mild thoracic spine spondylosis. No significant foraminal narrowing or canal stenosis. Electronically Signed   By: Edgardo Roys.D.  On: 03/30/2017 00:12   Ct Head Code Stroke W/o Cm  Result Date: 03/28/2017 CLINICAL DATA:  Code stroke. Left arm weakness beginning 4 hours ago. History of prostate cancer. EXAM: CT HEAD WITHOUT CONTRAST TECHNIQUE: Contiguous axial images were obtained from the base of the skull through the vertex without intravenous contrast. COMPARISON:  None. FINDINGS: Brain: Normal appearance without evidence of atrophy, old or acute infarction, mass lesion, hemorrhage, hydrocephalus or extra-axial collection. Vascular: No abnormal vascular finding. Skull: Normal Sinuses/Orbits: Clear except for small amount of fluid dependent in the right maxillary sinus. Old right orbital medial wall blowout fracture. Other: None significant ASPECTS ([place] Stroke Program Early CT Score) - Ganglionic level infarction (caudate, lentiform nuclei, internal capsule, insula, M1-M3 cortex): 7 - Supraganglionic infarction (M4-M6 cortex): 3 Total score (0-10 with 10 being normal): 10 IMPRESSION: 1. Normal head CT. 2. ASPECTS is 10. Page placed by telephone at the time of interpretation on 03/28/2017 at 2:17 pm to Dr. Cristobal Goldmann. Electronically Signed   By: Nelson Chimes M.D.   On: 03/28/2017 14:22    Microbiology: Recent Results (from the past 240 hour(s))  MRSA PCR Screening     Status: None   Collection Time: 03/28/17  9:23 PM  Result Value Ref Range Status   MRSA by PCR NEGATIVE NEGATIVE Final    Comment:        The GeneXpert MRSA Assay (FDA approved for NASAL specimens only), is one component of a comprehensive MRSA colonization surveillance program. It is not intended to diagnose MRSA infection nor to guide or monitor treatment for MRSA infections.      Labs: Basic Metabolic Panel:  Recent Labs Lab 03/28/17 1349 03/28/17 1411 03/29/17 0534 03/30/17 0527   NA 139 140 139 140  K 3.3* 3.3* 3.5 4.2  CL 104 100* 104 103  CO2 24  --  28 29  GLUCOSE 115* 110* 105* 106*  BUN 10 12 11 10   CREATININE 0.89 0.80 0.86 0.79  CALCIUM 9.5  --  8.9 9.1  MG  --   --  1.8 2.1   Liver Function Tests:  Recent Labs Lab 03/28/17 1349 03/29/17 0534  AST 19 15  ALT 16* 13*  ALKPHOS 53 48  BILITOT 0.6 0.4  PROT 7.0 6.3*  ALBUMIN 4.1 3.6   No results for input(s): LIPASE, AMYLASE in the last 168 hours. No results for input(s): AMMONIA in the last 168 hours. CBC:  Recent Labs Lab 03/28/17 1349 03/28/17 1411 03/29/17 0534 03/30/17 0527  WBC 7.5  --  8.3 7.2  NEUTROABS 4.3  --  3.7  --   HGB 12.9* 14.3 12.0* 12.1*  HCT 39.3 42.0 38.0* 38.0*  MCV 86.9  --  89.4 89.2  PLT 188  --  176 183   Cardiac Enzymes:  Recent Labs Lab 03/28/17 2002 03/29/17 0016 03/29/17 0534  TROPONINI <0.03 <0.03 <0.03   BNP: BNP (last 3 results)  Recent Labs  03/28/17 2002  BNP 12.7    ProBNP (last 3 results) No results for input(s): PROBNP in the last 8760 hours.  CBG:  Recent Labs Lab 03/29/17 1628 03/29/17 2110 03/30/17 0750 03/30/17 1124 03/30/17 1706  GLUCAP 94 111* 120* 129* 112*       Signed:  THOMPSON,DANIEL MD.  [facility] 03/30/2017, 8:06 PM

## 2017-05-01 ENCOUNTER — Emergency Department (HOSPITAL_COMMUNITY): Payer: Medicaid Other

## 2017-05-01 ENCOUNTER — Emergency Department (HOSPITAL_COMMUNITY)
Admission: EM | Admit: 2017-05-01 | Discharge: 2017-05-01 | Disposition: A | Discharge status: Home / Self Care | Payer: Medicaid Other | Attending: Emergency Medicine | Admitting: Emergency Medicine

## 2017-05-01 DIAGNOSIS — Z7982 Long term (current) use of aspirin: Secondary | ICD-10-CM | POA: Insufficient documentation

## 2017-05-01 DIAGNOSIS — R55 Syncope and collapse: Secondary | ICD-10-CM | POA: Diagnosis not present

## 2017-05-01 DIAGNOSIS — Z859 Personal history of malignant neoplasm, unspecified: Secondary | ICD-10-CM | POA: Diagnosis not present

## 2017-05-01 DIAGNOSIS — I10 Essential (primary) hypertension: Secondary | ICD-10-CM | POA: Diagnosis not present

## 2017-05-01 DIAGNOSIS — Z794 Long term (current) use of insulin: Secondary | ICD-10-CM | POA: Insufficient documentation

## 2017-05-01 DIAGNOSIS — Z79899 Other long term (current) drug therapy: Secondary | ICD-10-CM | POA: Insufficient documentation

## 2017-05-01 DIAGNOSIS — E876 Hypokalemia: Secondary | ICD-10-CM | POA: Diagnosis not present

## 2017-05-01 DIAGNOSIS — R0789 Other chest pain: Secondary | ICD-10-CM | POA: Diagnosis not present

## 2017-05-01 DIAGNOSIS — F17210 Nicotine dependence, cigarettes, uncomplicated: Secondary | ICD-10-CM | POA: Insufficient documentation

## 2017-05-01 DIAGNOSIS — R079 Chest pain, unspecified: Secondary | ICD-10-CM | POA: Diagnosis present

## 2017-05-01 DIAGNOSIS — E119 Type 2 diabetes mellitus without complications: Secondary | ICD-10-CM | POA: Diagnosis not present

## 2017-05-01 LAB — CBC
HCT: 38.2 % — ABNORMAL LOW (ref 39.0–52.0)
Hemoglobin: 12.9 g/dL — ABNORMAL LOW (ref 13.0–17.0)
MCH: 28.8 pg (ref 26.0–34.0)
MCHC: 33.8 g/dL (ref 30.0–36.0)
MCV: 85.3 fL (ref 78.0–100.0)
Platelets: 175 10*3/uL (ref 150–400)
RBC: 4.48 MIL/uL (ref 4.22–5.81)
RDW: 14 % (ref 11.5–15.5)
WBC: 5.2 10*3/uL (ref 4.0–10.5)

## 2017-05-01 LAB — BASIC METABOLIC PANEL
Anion gap: 11 (ref 5–15)
BUN: 10 mg/dL (ref 6–20)
CO2: 22 mmol/L (ref 22–32)
Calcium: 9.5 mg/dL (ref 8.9–10.3)
Chloride: 103 mmol/L (ref 101–111)
Creatinine, Ser: 0.86 mg/dL (ref 0.61–1.24)
GFR calc Af Amer: >60 mL/min (ref >60)
GFR calc non Af Amer: >60 mL/min (ref >60)
Glucose, Bld: 171 mg/dL — ABNORMAL HIGH (ref 65–99)
Potassium: 3.4 mmol/L — ABNORMAL LOW (ref 3.5–5.1)
Sodium: 136 mmol/L (ref 135–145)

## 2017-05-01 LAB — I-STAT TROPONIN, ED: Troponin i, poc: 0 ng/mL (ref 0.00–0.08)

## 2017-05-01 MED ORDER — GI COCKTAIL ~~LOC~~
30.0000 mL | Freq: Once | ORAL | Status: AC
  Administered 2017-05-01: 30 mL via ORAL
  Filled 2017-05-01: qty 30

## 2017-05-01 MED ORDER — OXYCODONE-ACETAMINOPHEN 5-325 MG PO TABS
1.0000 | ORAL_TABLET | Freq: Once | ORAL | Status: AC
Start: 2017-05-01 — End: 2017-05-01
  Administered 2017-05-01: 1 via ORAL
  Filled 2017-05-01: qty 1

## 2017-05-01 MED ORDER — LORAZEPAM 1 MG PO TABS
1.0000 mg | ORAL_TABLET | Freq: Once | ORAL | Status: AC
  Administered 2017-05-01: 1 mg via ORAL
  Filled 2017-05-01: qty 1

## 2017-05-01 NOTE — ED Triage Notes (Signed)
Per ems- pt reports central non radiating chest pain that started at 7am. Pt reports SOB as well. Pt received 3 nitro, 324mg  asprin and 10mg  of morphine. Pt denies any decrease in pain after received medication.

## 2017-05-01 NOTE — Discharge Instructions (Signed)
Please follow up with your doctor.
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
## 2017-05-01 NOTE — ED Notes (Signed)
Dr. Kohut at bedside 

## 2017-05-01 NOTE — ED Notes (Signed)
ED Provider at bedside. 

## 2017-05-28 ENCOUNTER — Encounter (HOSPITAL_COMMUNITY): Payer: Self-pay

## 2017-05-28 ENCOUNTER — Emergency Department (HOSPITAL_COMMUNITY): Payer: Medicaid Other

## 2017-05-28 ENCOUNTER — Observation Stay (HOSPITAL_COMMUNITY)
Admission: EM | Admit: 2017-05-28 | Discharge: 2017-05-30 | Disposition: A | Discharge status: Home / Self Care | Payer: Medicaid Other | Attending: Internal Medicine | Admitting: Internal Medicine

## 2017-05-28 DIAGNOSIS — Z7982 Long term (current) use of aspirin: Secondary | ICD-10-CM | POA: Insufficient documentation

## 2017-05-28 DIAGNOSIS — E119 Type 2 diabetes mellitus without complications: Secondary | ICD-10-CM

## 2017-05-28 DIAGNOSIS — Z79899 Other long term (current) drug therapy: Secondary | ICD-10-CM | POA: Diagnosis not present

## 2017-05-28 DIAGNOSIS — Z87891 Personal history of nicotine dependence: Secondary | ICD-10-CM | POA: Insufficient documentation

## 2017-05-28 DIAGNOSIS — R079 Chest pain, unspecified: Secondary | ICD-10-CM | POA: Diagnosis not present

## 2017-05-28 DIAGNOSIS — Z7984 Long term (current) use of oral hypoglycemic drugs: Secondary | ICD-10-CM | POA: Insufficient documentation

## 2017-05-28 DIAGNOSIS — I10 Essential (primary) hypertension: Secondary | ICD-10-CM | POA: Diagnosis not present

## 2017-05-28 DIAGNOSIS — I160 Hypertensive urgency: Secondary | ICD-10-CM

## 2017-05-28 DIAGNOSIS — Z8546 Personal history of malignant neoplasm of prostate: Secondary | ICD-10-CM

## 2017-05-28 DIAGNOSIS — Z794 Long term (current) use of insulin: Secondary | ICD-10-CM | POA: Diagnosis not present

## 2017-05-28 DIAGNOSIS — Z859 Personal history of malignant neoplasm, unspecified: Secondary | ICD-10-CM | POA: Diagnosis not present

## 2017-05-28 DIAGNOSIS — R0789 Other chest pain: Secondary | ICD-10-CM | POA: Diagnosis not present

## 2017-05-28 DIAGNOSIS — G8929 Other chronic pain: Secondary | ICD-10-CM | POA: Diagnosis present

## 2017-05-28 HISTORY — DX: Unspecified asthma, uncomplicated: J45.909

## 2017-05-28 HISTORY — DX: Dorsalgia, unspecified: M54.9

## 2017-05-28 HISTORY — DX: Type 2 diabetes mellitus without complications: E11.9

## 2017-05-28 HISTORY — DX: Unspecified convulsions: R56.9

## 2017-05-28 HISTORY — DX: Gastro-esophageal reflux disease without esophagitis: K21.9

## 2017-05-28 HISTORY — DX: Angina pectoris, unspecified: I20.9

## 2017-05-28 HISTORY — DX: Sleep apnea, unspecified: G47.30

## 2017-05-28 HISTORY — DX: Other chronic pain: G89.29

## 2017-05-28 HISTORY — DX: Malignant neoplasm of prostate: C61

## 2017-05-28 HISTORY — DX: Post-traumatic stress disorder, unspecified: F43.10

## 2017-05-28 LAB — URINALYSIS, ROUTINE W REFLEX MICROSCOPIC
BILIRUBIN URINE: NEGATIVE
Bacteria, UA: NONE SEEN
Glucose, UA: NEGATIVE mg/dL
Hgb urine dipstick: NEGATIVE
Ketones, ur: NEGATIVE mg/dL
Leukocytes, UA: NEGATIVE
Nitrite: NEGATIVE
Protein, ur: 30 mg/dL — AB
SPECIFIC GRAVITY, URINE: 1.026 (ref 1.005–1.030)
SQUAMOUS EPITHELIAL / LPF: NONE SEEN
pH: 5 (ref 5.0–8.0)

## 2017-05-28 LAB — BASIC METABOLIC PANEL
ANION GAP: 12 (ref 5–15)
BUN: 9 mg/dL (ref 6–20)
CALCIUM: 9.5 mg/dL (ref 8.9–10.3)
CO2: 22 mmol/L (ref 22–32)
Chloride: 103 mmol/L (ref 101–111)
Creatinine, Ser: 0.87 mg/dL (ref 0.61–1.24)
GFR calc non Af Amer: >60 mL/min (ref >60)
Glucose, Bld: 145 mg/dL — ABNORMAL HIGH (ref 65–99)
Potassium: 3.2 mmol/L — ABNORMAL LOW (ref 3.5–5.1)
SODIUM: 137 mmol/L (ref 135–145)

## 2017-05-28 LAB — RAPID URINE DRUG SCREEN, HOSP PERFORMED
Amphetamines: NOT DETECTED
Barbiturates: NOT DETECTED
Benzodiazepines: NOT DETECTED
COCAINE: NOT DETECTED
OPIATES: POSITIVE — AB
TETRAHYDROCANNABINOL: POSITIVE — AB

## 2017-05-28 LAB — I-STAT TROPONIN, ED
TROPONIN I, POC: 0.01 ng/mL (ref 0.00–0.08)
TROPONIN I, POC: 0.01 ng/mL (ref 0.00–0.08)

## 2017-05-28 LAB — TROPONIN I

## 2017-05-28 LAB — GLUCOSE, CAPILLARY
GLUCOSE-CAPILLARY: 173 mg/dL — AB (ref 65–99)
GLUCOSE-CAPILLARY: 156 mg/dL — AB (ref 65–99)

## 2017-05-28 LAB — CBC
HCT: 38.9 % — ABNORMAL LOW (ref 39.0–52.0)
Hemoglobin: 12.8 g/dL — ABNORMAL LOW (ref 13.0–17.0)
MCH: 28.3 pg (ref 26.0–34.0)
MCHC: 32.9 g/dL (ref 30.0–36.0)
MCV: 86.1 fL (ref 78.0–100.0)
Platelets: 201 10*3/uL (ref 150–400)
RBC: 4.52 MIL/uL (ref 4.22–5.81)
RDW: 14.6 % (ref 11.5–15.5)
WBC: 5.7 10*3/uL (ref 4.0–10.5)

## 2017-05-28 MED ORDER — NEBIVOLOL HCL 5 MG PO TABS
10.0000 mg | ORAL_TABLET | Freq: Every day | ORAL | Status: DC
  Administered 2017-05-29 – 2017-05-30 (×2): 10 mg via ORAL
  Filled 2017-05-28 (×2): qty 2

## 2017-05-28 MED ORDER — INSULIN GLARGINE 100 UNIT/ML SOLOSTAR PEN: 15.0000 [IU] | PEN_INJECTOR | Freq: Two times a day (BID) | SUBCUTANEOUS | Status: DC

## 2017-05-28 MED ORDER — OXYCODONE HCL 5 MG PO TABS
15.0000 mg | ORAL_TABLET | Freq: Four times a day (QID) | ORAL | Status: DC | PRN
  Administered 2017-05-28 – 2017-05-30 (×6): 15 mg via ORAL
  Filled 2017-05-28 (×6): qty 3

## 2017-05-28 MED ORDER — MORPHINE SULFATE (PF) 4 MG/ML IV SOLN
4.0000 mg | Freq: Once | INTRAVENOUS | Status: AC
  Administered 2017-05-28: 4 mg via INTRAVENOUS
  Filled 2017-05-28: qty 1

## 2017-05-28 MED ORDER — ALPRAZOLAM 0.5 MG PO TABS
1.0000 mg | ORAL_TABLET | Freq: Three times a day (TID) | ORAL | Status: DC
  Administered 2017-05-28 – 2017-05-30 (×5): 1 mg via ORAL
  Filled 2017-05-28 (×5): qty 2

## 2017-05-28 MED ORDER — CLONIDINE HCL 0.1 MG PO TABS
0.1000 mg | ORAL_TABLET | Freq: Three times a day (TID) | ORAL | Status: DC
  Administered 2017-05-28 – 2017-05-30 (×5): 0.1 mg via ORAL
  Filled 2017-05-28 (×5): qty 1

## 2017-05-28 MED ORDER — LORAZEPAM 1 MG PO TABS
1.0000 mg | ORAL_TABLET | Freq: Once | ORAL | Status: AC
  Administered 2017-05-28: 1 mg via ORAL
  Filled 2017-05-28: qty 1

## 2017-05-28 MED ORDER — ACETAMINOPHEN 325 MG PO TABS
650.0000 mg | ORAL_TABLET | ORAL | Status: DC | PRN
  Administered 2017-05-28: 650 mg via ORAL
  Filled 2017-05-28: qty 2

## 2017-05-28 MED ORDER — AMLODIPINE BESYLATE 5 MG PO TABS
5.0000 mg | ORAL_TABLET | Freq: Once | ORAL | Status: AC
  Administered 2017-05-28: 5 mg via ORAL
  Filled 2017-05-28: qty 1

## 2017-05-28 MED ORDER — LABETALOL HCL 5 MG/ML IV SOLN
20.0000 mg | Freq: Once | INTRAVENOUS | Status: AC
  Administered 2017-05-28: 20 mg via INTRAVENOUS
  Filled 2017-05-28: qty 4

## 2017-05-28 MED ORDER — CLONIDINE HCL 0.1 MG PO TABS
0.1000 mg | ORAL_TABLET | Freq: Once | ORAL | Status: AC
  Administered 2017-05-28: 0.1 mg via ORAL
  Filled 2017-05-28: qty 1

## 2017-05-28 MED ORDER — FENTANYL CITRATE (PF) 100 MCG/2ML IJ SOLN
50.0000 ug | Freq: Once | INTRAMUSCULAR | Status: AC
  Administered 2017-05-28: 50 ug via INTRAVENOUS
  Filled 2017-05-28: qty 2

## 2017-05-28 MED ORDER — HYDROCHLOROTHIAZIDE 12.5 MG PO CAPS
12.5000 mg | ORAL_CAPSULE | Freq: Two times a day (BID) | ORAL | Status: DC
  Administered 2017-05-28 – 2017-05-30 (×4): 12.5 mg via ORAL
  Filled 2017-05-28 (×4): qty 1

## 2017-05-28 MED ORDER — INSULIN GLARGINE 100 UNIT/ML ~~LOC~~ SOLN
15.0000 [IU] | Freq: Two times a day (BID) | SUBCUTANEOUS | Status: DC
  Administered 2017-05-28 – 2017-05-30 (×4): 15 [IU] via SUBCUTANEOUS
  Filled 2017-05-28 (×5): qty 0.15

## 2017-05-28 MED ORDER — VENLAFAXINE HCL 75 MG PO TABS
225.0000 mg | ORAL_TABLET | Freq: Every day | ORAL | Status: DC
Start: 2017-05-28 — End: 2017-05-28

## 2017-05-28 MED ORDER — FAMOTIDINE 20 MG PO TABS
10.0000 mg | ORAL_TABLET | Freq: Every day | ORAL | Status: DC
  Administered 2017-05-29 – 2017-05-30 (×2): 10 mg via ORAL
  Filled 2017-05-28 (×2): qty 1

## 2017-05-28 MED ORDER — LISINOPRIL-HYDROCHLOROTHIAZIDE 20-12.5 MG PO TABS: 2.0000 | ORAL_TABLET | Freq: Two times a day (BID) | ORAL | Status: DC

## 2017-05-28 MED ORDER — VENLAFAXINE HCL ER 75 MG PO CP24
225.0000 mg | ORAL_CAPSULE | Freq: Every day | ORAL | Status: DC
  Administered 2017-05-29 – 2017-05-30 (×2): 225 mg via ORAL
  Filled 2017-05-28 (×2): qty 1

## 2017-05-28 MED ORDER — INSULIN ASPART 100 UNIT/ML ~~LOC~~ SOLN
0.0000 [IU] | Freq: Three times a day (TID) | SUBCUTANEOUS | Status: DC
  Administered 2017-05-29: 3 [IU] via SUBCUTANEOUS
  Administered 2017-05-30: 1 [IU] via SUBCUTANEOUS
  Administered 2017-05-30: 2 [IU] via SUBCUTANEOUS

## 2017-05-28 MED ORDER — ENOXAPARIN SODIUM 40 MG/0.4ML ~~LOC~~ SOLN
40.0000 mg | SUBCUTANEOUS | Status: DC
  Administered 2017-05-28 – 2017-05-29 (×2): 40 mg via SUBCUTANEOUS
  Filled 2017-05-28 (×2): qty 0.4

## 2017-05-28 MED ORDER — ASPIRIN EC 81 MG PO TBEC
81.0000 mg | DELAYED_RELEASE_TABLET | Freq: Every day | ORAL | Status: DC
  Administered 2017-05-29 – 2017-05-30 (×2): 81 mg via ORAL
  Filled 2017-05-28 (×2): qty 1

## 2017-05-28 MED ORDER — SIMVASTATIN 10 MG PO TABS
10.0000 mg | ORAL_TABLET | Freq: Every day | ORAL | Status: DC
  Administered 2017-05-28 – 2017-05-29 (×2): 10 mg via ORAL
  Filled 2017-05-28 (×2): qty 1

## 2017-05-28 MED ORDER — BUSPIRONE HCL 5 MG PO TABS
7.5000 mg | ORAL_TABLET | Freq: Three times a day (TID) | ORAL | Status: DC
  Administered 2017-05-28 – 2017-05-30 (×5): 7.5 mg via ORAL
  Filled 2017-05-28 (×5): qty 2

## 2017-05-28 MED ORDER — AMLODIPINE BESYLATE 10 MG PO TABS
10.0000 mg | ORAL_TABLET | Freq: Every day | ORAL | Status: DC
  Administered 2017-05-29 – 2017-05-30 (×2): 10 mg via ORAL
  Filled 2017-05-28 (×2): qty 1

## 2017-05-28 MED ORDER — LISINOPRIL 20 MG PO TABS
20.0000 mg | ORAL_TABLET | Freq: Two times a day (BID) | ORAL | Status: DC
Start: 2017-05-28 — End: 2017-05-30
  Administered 2017-05-28 – 2017-05-30 (×4): 20 mg via ORAL
  Filled 2017-05-28 (×4): qty 1

## 2017-05-28 MED ORDER — OXYCODONE HCL ER 10 MG PO T12A
10.0000 mg | EXTENDED_RELEASE_TABLET | Freq: Three times a day (TID) | ORAL | Status: DC
  Administered 2017-05-28 – 2017-05-30 (×3): 10 mg via ORAL
  Filled 2017-05-28 (×6): qty 1

## 2017-05-28 MED ORDER — ONDANSETRON HCL 4 MG/2ML IJ SOLN: 4.0000 mg | Freq: Four times a day (QID) | INTRAMUSCULAR | Status: DC | PRN

## 2017-05-28 MED ORDER — PHENYTOIN SODIUM EXTENDED 100 MG PO CAPS
100.0000 mg | ORAL_CAPSULE | Freq: Two times a day (BID) | ORAL | Status: DC
  Administered 2017-05-28 – 2017-05-30 (×4): 100 mg via ORAL
  Filled 2017-05-28 (×4): qty 1

## 2017-05-28 MED ORDER — NITROGLYCERIN 0.4 MG SL SUBL: 0.4000 mg | SUBLINGUAL_TABLET | SUBLINGUAL | Status: DC | PRN

## 2017-05-28 MED ORDER — LORAZEPAM 2 MG/ML IJ SOLN
1.0000 mg | Freq: Once | INTRAMUSCULAR | Status: AC
  Administered 2017-05-28: 1 mg via INTRAVENOUS
  Filled 2017-05-28: qty 1

## 2017-05-28 NOTE — ED Provider Notes (Signed)
[facility] DEPT Provider Note   CSN: [id number] Arrival date & time: 05/28/17  1119     History   Chief Complaint Chief Complaint  Patient presents with  . Chest Pain  . Hypertension    HPI Evan Mcdaniel is a 56 y.o. male with PMH/o HTN, DM who presents with chest pain that began at approximately 9 AM this morning. Patient states t chest pain is left-sided and nonradiating. He describes it as a "intense pressure." Patient states that initially when chest pain began he was diaphoretic and nauseous. Patient states that chest pain was worse with deep inspiration and exertion. He states initially when pain began he had a little bi shortness of breath butHe took one nitroglycerin at home combined with aspirin with no improvement in pain, prompting EMS call. On EMS arrival, BP was 226/120. Patient was given additional nitroglycerin with no improvement in pain. On ED arrival, patient states that pain is 10/10. No nausea, diaphoresis, difficulty breathing on ED arrival. Patient states that his blood pressure medication was recently increased approximate one month ago. He states that he has been compliant with his medications. Patient does report a personal cardiac history. He states he had an MI in 2016 and had a cath at [facility]. Patient states that he does have a positive family history and states that his siblings and mother have had heart attacks. Patient denies any recent fevers/chills, abdominal pain, vomiting, dysuria, hematuria, numbness/weakness of his arms or legs.  The history is provided by the patient.    Past Medical History:  Diagnosis Date  . Cancer ([place])   . Chronic pain   . Diabetes mellitus without complication ([place])   . History of cardiac cath    a. cardiac cath in 09/2016 showing normal cors.   . Hypertension     Patient Active Problem List   Diagnosis Date Noted  . Left-sided weakness   . Atypical chest pain 03/28/2017  . Left sided numbness 03/28/2017  . Marijuana  use 03/28/2017  . Accelerated hypertension   . Diabetes mellitus without complication ([place])   . Chronic pain   . Cancer [facility])     Past Surgical History:  Procedure Laterality Date  . PROSTATECTOMY         Home Medications    Prior to Admission medications   Medication Sig Start Date End Date Taking? Authorizing Provider  albuterol (PROVENTIL HFA;VENTOLIN HFA) 108 (90 Base) MCG/ACT inhaler Inhale 2 puffs into the lungs every 6 (six) hours as needed for wheezing or shortness of breath.    [provider]  ALPRAZolam Duanne Moron) 1 MG tablet Take 1 tablet (1 mg total) by mouth 3 (three) times daily. 03/30/17   Eugenie Filler, MD  amLODipine (NORVASC) 10 MG tablet Take 1 tablet (10 mg total) by mouth daily. 01/15/17   Pattricia Boss, MD  aspirin EC 81 MG tablet Take 81 mg by mouth daily.     [provider]  busPIRone (BUSPAR) 7.5 MG tablet Take 7.5 mg by mouth 3 (three) times daily. 01/15/17   [provider]  BYSTOLIC 10 MG tablet Take 10 mg by mouth daily. 02/22/17   [provider]  CHANTIX STARTING MONTH PAK 0.5 MG X 11 & 1 MG X 42 tablet See admin instructions. 01/31/17   [provider]  cloNIDine (CATAPRES) 0.1 MG tablet Take 0.1 mg by mouth 2 (two) times daily as needed. 01/31/17   [provider]  diphenhydrAMINE (BENADRYL) 25 MG tablet Take 50  mg by mouth every 6 (six) hours as needed for allergies.    [provider]  escitalopram (LEXAPRO) 10 MG tablet Take 10 mg by mouth daily.    [provider]  famotidine (PEPCID) 10 MG tablet Take 10 mg by mouth daily. 12/01/16   [provider]  LANTUS SOLOSTAR 100 UNIT/ML Solostar Pen Inject 20 Units into the skin 2 (two) times daily.  12/01/16   [provider]  lisinopril-hydrochlorothiazide (PRINZIDE,ZESTORETIC) 20-12.5 MG tablet Take 1 tablet by mouth 2 (two) times daily.    [provider]  metFORMIN (GLUCOPHAGE) 1000 MG tablet Take 1 tablet  (1,000 mg total) by mouth 2 (two) times daily with a meal. 01/15/17   Pattricia Boss, MD  nicotine (NICODERM CQ - DOSED IN MG/24 HOURS) 14 mg/24hr patch Place 1 patch (14 mg total) onto the skin daily. 03/31/17   Eugenie Filler, MD  nitroGLYCERIN (NITROSTAT) 0.4 MG SL tablet Place 0.4 mg under the tongue every 5 (five) minutes as needed for chest pain.    [provider]  oxyCODONE (OXYCONTIN) 10 mg 12 hr tablet Take 1 tablet (10 mg total) by mouth 3 (three) times daily. 03/30/17   Eugenie Filler, MD  oxyCODONE (ROXICODONE) 15 MG immediate release tablet Take 1 tablet (15 mg total) by mouth 2 (two) times daily as needed for pain. 03/30/17   Eugenie Filler, MD  phenytoin (DILANTIN) 100 MG ER capsule Take 100 mg by mouth 2 (two) times daily.     [provider]  simvastatin (ZOCOR) 10 MG tablet Take 10 mg by mouth daily at 6 PM.    [provider]  sitaGLIPtin (JANUVIA) 50 MG tablet Take 50 mg by mouth daily.    [provider]  venlafaxine (EFFEXOR) 75 MG tablet Take 3 tablets (225 mg total) by mouth daily. 01/15/17   Pattricia Boss, MD  Vitamin D, Ergocalciferol, (DRISDOL) 50000 units CAPS capsule Take 50,000 Units by mouth every Monday.    [provider]    Family History Family History  Problem Relation Age of Onset  . Diabetes Mother   . Heart attack Father 75  . Heart attack Sister   . Heart attack Brother 3  . Heart attack Brother     Social History Social History  Substance Use Topics  . Smoking status: Former Smoker    Packs/day: 0.25    Types: Cigarettes    Quit date: 10/03/2013  . Smokeless tobacco: Never Used  . Alcohol use No     Allergies   Naproxen and Vicodin [hydrocodone-acetaminophen]   Review of Systems Review of Systems  Constitutional: Positive for diaphoresis. Negative for chills and fever.  Eyes: Negative for visual disturbance.  Respiratory: Negative for cough and shortness of breath.   Cardiovascular:  Positive for chest pain.  Gastrointestinal: Positive for nausea. Negative for abdominal pain, diarrhea and vomiting.  Genitourinary: Negative for dysuria and hematuria.  Musculoskeletal: Negative for back pain and neck pain.  Skin: Negative for rash.  Neurological: Negative for dizziness, weakness, numbness and headaches.  Psychiatric/Behavioral: Negative for confusion.     Physical Exam Updated Vital Signs BP (!) 145/111 (BP Location: Right Arm)   Pulse 98   Temp 98.5 F (36.9 C) (Oral)   Resp 16   Ht 5\' 11"  (1.803 m)   Wt 93 kg (205 lb)   SpO2 98%   BMI 28.59 kg/m   Physical Exam  Constitutional: He is oriented to person, place, and time.  He appears well-developed and well-nourished.  Appears uncomfortable but no acute distress   HENT:  Head: Normocephalic and atraumatic.  Mouth/Throat: Oropharynx is clear and moist and mucous membranes are normal.  Eyes: Pupils are equal, round, and reactive to light. Conjunctivae, EOM and lids are normal.  Neck: Full passive range of motion without pain.  Cardiovascular: Normal rate, regular rhythm, normal heart sounds and normal pulses.  Exam reveals no gallop and no friction rub.   No murmur heard. Pulmonary/Chest: Effort normal and breath sounds normal.  Abdominal: Soft. Normal appearance. There is no tenderness. There is no rigidity and no guarding.  Musculoskeletal: Normal range of motion.  Neurological: He is alert and oriented to person, place, and time.  Skin: Skin is warm and dry. Capillary refill takes less than 2 seconds.  Psychiatric: His speech is normal. His mood appears anxious.  Nursing note and vitals reviewed.    ED Treatments / Results  Labs (all labs ordered are listed, but only abnormal results are displayed) Labs Reviewed  BASIC METABOLIC PANEL  CBC  I-STAT TROPONIN, ED    EKG  EKG Interpretation None       Radiology No results found.  Procedures Procedures (including critical care  time)  Medications Ordered in ED Medications - No data to display   Initial Impression / Assessment and Plan / ED Course  I have reviewed the triage vital signs and the nursing notes.  Pertinent labs & imaging results that were available during my care of the patient were reviewed by me and considered in my medical decision making (see chart for details).     56 y.o. M with PMH/o HTN who presents with chest pain that began approximately 9 AM this morning. Took one nitroglycerin and aspirin at home with no improvement in symptoms. EMS was called and initial BP was in the 200s. Patient and additional nitroglycerin en route with no improvement. Patient is afebrile, non-toxic appearing. Vital signs reviewed and stable. Patient is slightly hypertensive but it is improved., Likely secondary to pain. Plan to check basic labs including CBC, BMP, troponin, EKG, chest x-ray, UA, UDS. Analgesics provided in the department.  Records reviewed. Patient has been seen in the ED 3 times in the last 6 months for similar chest pain. In June 2018 he was admitted for chest pain and stroke workup. He was evaluated by cardiology at that time who increase his blood pressure medications. Patient states that he had an MI in 2016 or 2017. Records reviewed showed that he was seen at [facility] and had a that Showed 0 Vessel Disease. Patient Is Not Currently Followed by Cardiology.  Labs and imaging reviewed. CBC shows slight anemia. Her to use that this consistent with prior. Troponin is negative. BMP shows slight hypokalemia. UA is pending. EKG shows sinus tach, rate 101. There is some abnormal T waves in 1 and aVL. When compared to 05/01/17, this is new. Discussed results with patient. Patient appears to be very stressed and gets very upset when talking about his health. Will plan to give some Ativan to help with symptoms.  Evaluation after Ativan. Patient reports that he has had improvement in pain to 9/10. He is still  experiencing left-sided chest pain. Blood pressure continues to remain elevated. Patient states that he took blood pressure medications today prior to ED arrival. Plan to give second dose of Ativan to help with symptoms. Plan to give 5 of amlodipine for blood pressure control.  Reevaluation. Patient states that he  is still having 8/10 chest pain. Minimally improved with Ativan. Will plan to repeat troponin. Blood pressure is still elevated at 162/108. When I discussed with patient, his blood pressure comes up into the 180s/190s.   Based on patient's history and presentation, he has a heart sort of 5 which makes him at risk. Will likely need admission for chest pain rule out and hypertensive urgency. Plan to consult cardiology and hospitalist for admission.  Discussed with Dr. Beau Fanny (internal medicine). Will plan to admit.   Final Clinical Impressions(s) / ED Diagnoses   Final diagnoses:  Chest pain, unspecified type  Hypertensive urgency    New Prescriptions New Prescriptions   No medications on file     Desma Mcgregor 05/29/17 0127    Duffy Bruce, MD 05/29/17 [phone]

## 2017-05-28 NOTE — Plan of Care (Signed)
Problem: Activity: Goal: Risk for activity intolerance will decrease Outcome: Progressing Pt stated having a fall prior to admission d/t dizziness from pain. Pt states still feeling dizzy when getting up. Educated pt on the importance of calling for assistance before exiting bed. Pt verbally agreed to compliance.

## 2017-05-28 NOTE — ED Notes (Signed)
Pt. Transported to xray at this time on telemetry monitor.

## 2017-05-28 NOTE — ED Notes (Signed)
EDP at bedside  

## 2017-05-28 NOTE — ED Notes (Signed)
EDP at bedside.  RN requested urine specimen from patient. Pt. Stated understanding.

## 2017-05-28 NOTE — ED Notes (Signed)
Admitting team at bedside, Pt will go upstairs after

## 2017-05-28 NOTE — H&P (Signed)
Date: 05/28/2017               Patient Name:  Evan Mcdaniel MRN: [id number]  DOB: Mar 26, 1961 Age / Sex: 56 y.o., male   PCP: [name], MD         Medical Service: Internal Medicine Teaching Service         Attending Physician: [name], MD    First Contact: Dr. Ronalee Red Pager: [phone]  Second Contact: Dr. Charlynn Grimes Pager: [phone]       After Hours (After 5p/  First Contact Pager: [phone]  weekends / holidays): Second Contact Pager: [phone]   Chief Complaint: chest pain  History of Present Illness:  Evan Mcdaniel is a 56yo male with PMH significant for HTN, DM, PTSD, and prostate cancer (currently receiving radiation therapy every 2 weeks) who presents with chest pain that began this morning. He was sitting down in his usual state of health when he suddenly developed left-sided chest pain that radiates to his left neck. He describes the pain as "sharp and stabbing". Associated diaphoresis, shortness of breath, nausea and emesis x1. He went up to go to the bathroom and while urinating, he felt warm and passed out. He states his daughter and son caught him. He had another episode of syncope last night. He states he stood up and passed out. Denies hitting his head. The persistent chest pain prompted him to call EMS. BP was 226/120. Received nitroglycerin with no improvement in his pain.  In the ER, BP 158/110, HR 87, afebrile at 98.5, RR 14, O2 97% on RA. He states his pain was 10/10. Troponin negative x2. BP remained elevated despite amlodipine 5mg . He received morphine and ativan in the ED without much relief of his symptoms. EKG with some new abnormal T waves in lead I and aVL. UDS positive for opiates and marijuana. CXR normal. Patient states clonidine and lisinopril were recently increased, and he says he is compliant with his medications. He continues to have constant severe chest pain. He states that 4 years ago on this day, his daughter passed away.  On my  interview with him, he states that none of the pain medications he has received in the hospital have helped (has gotten ativan, morphine, and fentanyl). He states "Let's try my home pain medications to see if that helps". He says he takes roxicodone and oxycontin at home for chronic back pain and pain from prostate cancer. He states that he gets these medications monthly from a physician in [place], [place] and that he is now out of these medications and it's not time in the month to get them again yet. On review of [place], he has multiple prescriptions from various providers, most recently for alprazolam, oxycodone, and oxycontin. He has not received oxycontin since the end of June. He last received a 5-day supply of oxycodone on 8/6 and neither of these providers are from [place].  He has been seen in the ED 3 times in the last 6 months for similar chest pain. Most recently, he was seen in June 2018 for chest pain. Cardiology felt that he did not require ischemic work-up and that his chest pain was likely related to stress and PTSD. He had a LHC at [facility] in 09/2016, which was normal. LVEF 65%.  Meds:  Current Meds  Medication Sig  . albuterol (PROVENTIL HFA;VENTOLIN HFA) 108 (90 Base) MCG/ACT inhaler Inhale 2 puffs into the lungs every 6 (six) hours as needed for  wheezing or shortness of breath.  . ALPRAZolam (XANAX) 1 MG tablet Take 1 tablet (1 mg total) by mouth 3 (three) times daily.  Marland Kitchen amLODipine (NORVASC) 10 MG tablet Take 1 tablet (10 mg total) by mouth daily. (Patient taking differently: Take 20 mg by mouth daily. )  . aspirin EC 81 MG tablet Take 81 mg by mouth daily.   . busPIRone (BUSPAR) 7.5 MG tablet Take 7.5 mg by mouth 3 (three) times daily.  Marland Kitchen BYSTOLIC 10 MG tablet Take 10 mg by mouth daily.  . cloNIDine (CATAPRES) 0.1 MG tablet Take 0.1 mg by mouth 3 (three) times daily.   . diphenhydrAMINE (BENADRYL) 25 MG tablet Take 50 mg by mouth every 6 (six) hours as needed for allergies.    . famotidine (PEPCID) 10 MG tablet Take 10 mg by mouth daily.  Marland Kitchen LANTUS SOLOSTAR 100 UNIT/ML Solostar Pen Inject 20 Units into the skin 2 (two) times daily.   Marland Kitchen lisinopril-hydrochlorothiazide (PRINZIDE,ZESTORETIC) 20-12.5 MG tablet Take 2 tablets by mouth 2 (two) times daily.   . metFORMIN (GLUCOPHAGE) 1000 MG tablet Take 1 tablet (1,000 mg total) by mouth 2 (two) times daily with a meal.  . nitroGLYCERIN (NITROSTAT) 0.4 MG SL tablet Place 0.4 mg under the tongue every 5 (five) minutes as needed for chest pain.  Marland Kitchen oxyCODONE (OXYCONTIN) 10 mg 12 hr tablet Take 1 tablet (10 mg total) by mouth 3 (three) times daily.  Marland Kitchen oxyCODONE (ROXICODONE) 15 MG immediate release tablet Take 1 tablet (15 mg total) by mouth 2 (two) times daily as needed for pain. (Patient taking differently: Take 15 mg by mouth 3 (three) times daily. )  . phenytoin (DILANTIN) 100 MG ER capsule Take 100 mg by mouth 2 (two) times daily.   . simvastatin (ZOCOR) 10 MG tablet Take 10 mg by mouth daily at 6 PM.  . sitaGLIPtin (JANUVIA) 50 MG tablet Take 50 mg by mouth daily.  Marland Kitchen venlafaxine (EFFEXOR) 75 MG tablet Take 3 tablets (225 mg total) by mouth daily.  . Vitamin D, Ergocalciferol, (DRISDOL) 50000 units CAPS capsule Take 50,000 Units by mouth every Monday.   Allergies: Allergies as of 05/28/2017 - Review Complete 05/28/2017  Allergen Reaction Noted  . Naproxen Hives 01/13/2017  . Vicodin [hydrocodone-acetaminophen] Hives 01/13/2017   Past Medical History:  Diagnosis Date  . Cancer ([place])   . Chronic pain   . Diabetes mellitus without complication ([place])   . History of cardiac cath    a. cardiac cath in 09/2016 showing normal cors.   . Hypertension    Family History: Family History  Problem Relation Age of Onset  . Diabetes Mother   . Heart attack Father 70  . Heart attack Sister   . Heart attack Brother 3  . Heart attack Brother    Social History: - former smoker - denies alcohol use or other illicit drugs (UDS  positive for THC and opiates) - lives at home with his wife, daughter, son, and Eldridge Abrahams - is a veteran  Review of Systems: Constitutional: Negative for fever, malaise/fatigue, and weight loss. Positive for diaphoresis. HEENT: Negative for blurred vision, hearing loss, sinus pain, congestion, and sore throat. Respiratory: Positive for mild cough, shortness of breath. Cardiovascular: Positive for chest pain. Negative for palpitations, orthopnea, PND, and leg swelling. Gastrointestinal: Negative for abdominal pain, constipation, diarrhea, and heartburn. Positive for N/V, chronic blood in stool (which patient states is due to prostate) Genitourinary: Positive for chronic dysuria and hematuria (which patient states is due  to prostate) Musculoskeletal: Negative for joint pain and myalgias. Neurological: Negative for dizziness, focal weakness, weakness. Positive for headaches.  Physical Exam: Blood pressure (!) 149/100, pulse 92, temperature 98.5 F (36.9 C), temperature source Oral, resp. rate 17, height 5\' 11"  (1.803 m), weight 205 lb (93 kg), SpO2 97 %.  GEN: Well-appearing AA male sitting in bed comfortably in NAD HENT: Moist mucous membranes. No visible lesions. EYES: PERRL. Sclera anicteric. RESP: Clear to auscultation bilaterally. No wheezes, rales, or rhonchi. No increased work of breathing. CV: Normal rate and regular rhythm. No murmurs, gallops, or rubs. No LE edema. No tenderness to palpation. ABD: Soft. Non-tender. Non-distended. Normoactive bowel sounds. EXT: No edema. Warm. 2+ DP pulses bilaterally.  Labs CBC Latest Ref Rng & Units 05/28/2017 05/01/2017 03/30/2017  WBC 4.0 - 10.5 K/uL 5.7 5.2 7.2  Hemoglobin 13.0 - 17.0 g/dL 12.8(L) 12.9(L) 12.1(L)  Hematocrit 39.0 - 52.0 % 38.9(L) 38.2(L) 38.0(L)  Platelets 150 - 400 K/uL 201 175 183   CMP Latest Ref Rng & Units 05/28/2017 05/01/2017 03/30/2017  Glucose 65 - 99 mg/dL 145(H) 171(H) 106(H)  BUN 6 - 20 mg/dL 9 10 10   Creatinine  0.61 - 1.24 mg/dL 0.87 0.86 0.79  Sodium 135 - 145 mmol/L 137 136 140  Potassium 3.5 - 5.1 mmol/L 3.2(L) 3.4(L) 4.2  Chloride 101 - 111 mmol/L 103 103 103  CO2 22 - 32 mmol/L 22 22 29   Calcium 8.9 - 10.3 mg/dL 9.5 9.5 9.1  Total Protein 6.5 - 8.1 g/dL - - -  Total Bilirubin 0.3 - 1.2 mg/dL - - -  Alkaline Phos 38 - 126 U/L - - -  AST 15 - 41 U/L - - -  ALT 17 - 63 U/L - - -   Troponin neg x3 UDS positive for opiates and THC UA with 30 protein  EKG: new T wave inversions in lead I and aVL  CXR: normal  Assessment & Plan by Problem: Active Problems:   Chest pain  Mr. Brabec is a 56yo male with PMH significant for HTN, DM, PTSD, chronic back pain, and prostate cancer (currently receiving radiation therapy every 2 weeks) who presents with acute onset severe, "sharp stabbing" chest pain associated with diaphoresis, nausea, and emesis. Continues to endorse chest pain despite NTG, morphine, and ativan. States he would like to try his home pain regimen, which includes oxycontin and oxycodone.  # Chest pain Likely secondary to anxiety and stress. Patient's HEART score is a 3 or 4 (low-moderate risk). Patient's symptoms are not very consistent - has sharp pain that is constant and not relieved with NTG or rest. Troponins have been negative. He has also presented several times in the past with similar symptoms and has had negative ischemic work-up, including a LHC in 09/2016 which showed 0 coronary disease. Patient also tells [place] that today is the 4th anniversary of his daughter's death. He has had similar episodes at landmarks, including in June when his daughter's murderer was sentenced. He asks for his home pain medications to help with his pain, noting that he normally gets these prescribed monthly by a provider in [place] but has run out of these medications at home. With this story and negative work-up thus far, I suspect that his chest pain is mostly due to anxiety and stress, rather than  intrinsic cardiac disease. - trend troponin - repeat EKG tomorrow AM - continue aspirin - home oxycodone, oxycontin for pain management - SL NTG as needed - continue home buspirone  and xanax for anxiety  # Syncope x2 without LOC First episode last night sounds like it may have been orthostatic. Patient went to stand up and then passed out. Did not lose consciousness. This morning, he states he was urinating and passed out. Caught by his kids. Likely situational syncope. - continue to monitor  # HTN BP elevated to 140s-170s/90s-110s in the hospital. HR 70s-90s. Patient reports compliance with his home BP medications. Has received amlodipine 5mg , clonidine 0.1mg , and IV labetalol 20mg . Will restart patient's home medications and monitor his BP. - amlodipine 10mg  daily - clonidine 0.1mg  TID - lisinopril 20mg  BID - HCTZ 12.5mg  BID - nebivolol 10mg  daily  # DM - lantus 15u BID - SSI - CBG  # HLD  - simvastatin  Diet: diabetic diet VTE PPx: Lovenox  Dispo: Admit patient to Observation with expected length of stay less than 2 midnights.  Signed: Colbert Ewing, MD  Internal Medicine, PGY-1 05/28/2017, 5:11 PM  P [phone]

## 2017-05-28 NOTE — ED Triage Notes (Signed)
Pt. Here for left chest pain radiating to left arm and neck. Pt. Took 1 nitro at home with no relief. EMS gave 324 ASA and 2x nitro again with no relief. Pt. BP 226/120 en route. Pt. BP has since lowered. Pt. Hx of cardiac cath, but no interventions. Pt. States he's been here a few times with this problem and has never been helped. Pt. Skin dry and respirations equal.

## 2017-05-29 ENCOUNTER — Encounter (HOSPITAL_COMMUNITY): Payer: Self-pay | Admitting: General Practice

## 2017-05-29 DIAGNOSIS — X58XXXD Exposure to other specified factors, subsequent encounter: Secondary | ICD-10-CM | POA: Diagnosis not present

## 2017-05-29 DIAGNOSIS — E119 Type 2 diabetes mellitus without complications: Secondary | ICD-10-CM

## 2017-05-29 DIAGNOSIS — G8929 Other chronic pain: Secondary | ICD-10-CM

## 2017-05-29 DIAGNOSIS — E785 Hyperlipidemia, unspecified: Secondary | ICD-10-CM | POA: Diagnosis not present

## 2017-05-29 DIAGNOSIS — Z794 Long term (current) use of insulin: Secondary | ICD-10-CM | POA: Diagnosis not present

## 2017-05-29 DIAGNOSIS — R0789 Other chest pain: Secondary | ICD-10-CM | POA: Diagnosis not present

## 2017-05-29 DIAGNOSIS — M545 Low back pain: Secondary | ICD-10-CM

## 2017-05-29 DIAGNOSIS — F4312 Post-traumatic stress disorder, chronic: Secondary | ICD-10-CM

## 2017-05-29 DIAGNOSIS — R55 Syncope and collapse: Secondary | ICD-10-CM

## 2017-05-29 DIAGNOSIS — R079 Chest pain, unspecified: Secondary | ICD-10-CM | POA: Diagnosis not present

## 2017-05-29 DIAGNOSIS — F419 Anxiety disorder, unspecified: Secondary | ICD-10-CM

## 2017-05-29 DIAGNOSIS — Z79891 Long term (current) use of opiate analgesic: Secondary | ICD-10-CM

## 2017-05-29 DIAGNOSIS — I10 Essential (primary) hypertension: Secondary | ICD-10-CM

## 2017-05-29 DIAGNOSIS — C61 Malignant neoplasm of prostate: Secondary | ICD-10-CM | POA: Diagnosis not present

## 2017-05-29 DIAGNOSIS — Z7982 Long term (current) use of aspirin: Secondary | ICD-10-CM | POA: Diagnosis not present

## 2017-05-29 DIAGNOSIS — Z79899 Other long term (current) drug therapy: Secondary | ICD-10-CM

## 2017-05-29 LAB — TROPONIN I

## 2017-05-29 LAB — GLUCOSE, CAPILLARY
GLUCOSE-CAPILLARY: 111 mg/dL — AB (ref 65–99)
Glucose-Capillary: 109 mg/dL — ABNORMAL HIGH (ref 65–99)
Glucose-Capillary: 216 mg/dL — ABNORMAL HIGH (ref 65–99)
Glucose-Capillary: 99 mg/dL (ref 65–99)

## 2017-05-29 LAB — MRSA PCR SCREENING: MRSA by PCR: NEGATIVE

## 2017-05-29 MED ORDER — ALUM & MAG HYDROXIDE-SIMETH 200-200-20 MG/5ML PO SUSP
30.0000 mL | ORAL | Status: DC | PRN
  Administered 2017-05-29: 30 mL via ORAL
  Filled 2017-05-29: qty 30

## 2017-05-29 NOTE — Discharge Summary (Signed)
Name: Evan Mcdaniel MRN: [id number] DOB: 1961-08-31 56 y.o. PCP: [name], MD  Date of Admission: 05/28/2017 11:19 AM Date of Discharge: 05/30/2017 Attending Physician: No att. providers found  Discharge Diagnosis: 1. Chest pain 2. HTN  Discharge Medications: Allergies as of 05/30/2017      Reactions   Naproxen Hives   Vicodin [hydrocodone-acetaminophen] Hives      Medication List    TAKE these medications   albuterol 108 (90 Base) MCG/ACT inhaler Commonly known as:  PROVENTIL HFA;VENTOLIN HFA Inhale 2 puffs into the lungs every 6 (six) hours as needed for wheezing or shortness of breath.   ALPRAZolam 1 MG tablet Commonly known as:  XANAX Take 1 tablet (1 mg total) by mouth 3 (three) times daily.   amLODipine 10 MG tablet Commonly known as:  NORVASC Take 1 tablet (10 mg total) by mouth daily. What changed:  how much to take   aspirin EC 81 MG tablet Take 81 mg by mouth daily.   busPIRone 7.5 MG tablet Commonly known as:  BUSPAR Take 7.5 mg by mouth 3 (three) times daily.   BYSTOLIC 10 MG tablet Generic drug:  nebivolol Take 10 mg by mouth daily.   cloNIDine 0.1 MG tablet Commonly known as:  CATAPRES Take 0.1 mg by mouth 3 (three) times daily.   diphenhydrAMINE 25 MG tablet Commonly known as:  BENADRYL Take 50 mg by mouth every 6 (six) hours as needed for allergies.   famotidine 10 MG tablet Commonly known as:  PEPCID Take 10 mg by mouth daily.   LANTUS SOLOSTAR 100 UNIT/ML Solostar Pen Generic drug:  Insulin Glargine Inject 20 Units into the skin 2 (two) times daily.   lisinopril-hydrochlorothiazide 20-12.5 MG tablet Commonly known as:  PRINZIDE,ZESTORETIC Take 2 tablets by mouth 2 (two) times daily.   metFORMIN 1000 MG tablet Commonly known as:  GLUCOPHAGE Take 1 tablet (1,000 mg total) by mouth 2 (two) times daily with a meal.   nitroGLYCERIN 0.4 MG SL tablet Commonly known as:  NITROSTAT Place 0.4 mg under the tongue every  5 (five) minutes as needed for chest pain.   oxyCODONE 10 mg 12 hr tablet Commonly known as:  OXYCONTIN Take 1 tablet (10 mg total) by mouth 3 (three) times daily. What changed:  Another medication with the same name was changed. Make sure you understand how and when to take each.   oxyCODONE 15 MG immediate release tablet Commonly known as:  ROXICODONE Take 1 tablet (15 mg total) by mouth 2 (two) times daily as needed for pain. What changed:  when to take this   phenytoin 100 MG ER capsule Commonly known as:  DILANTIN Take 100 mg by mouth 2 (two) times daily.   simvastatin 10 MG tablet Commonly known as:  ZOCOR Take 10 mg by mouth daily at 6 PM.   sitaGLIPtin 50 MG tablet Commonly known as:  JANUVIA Take 50 mg by mouth daily.   venlafaxine 75 MG tablet Commonly known as:  EFFEXOR Take 3 tablets (225 mg total) by mouth daily.   Vitamin D (Ergocalciferol) 50000 units Caps capsule Commonly known as:  DRISDOL Take 50,000 Units by mouth every Monday.            Discharge Care Instructions        Start     Ordered   05/30/17 0000  Increase activity slowly     05/30/17 1034   05/30/17 0000  Diet - low sodium heart healthy  05/30/17 1034   05/30/17 0000  Call MD for:  temperature >100.4     05/30/17 1034   05/30/17 0000  Call MD for:  severe uncontrolled pain     05/30/17 1034   05/30/17 0000  Call MD for:  persistant dizziness or light-headedness     05/30/17 1034   05/30/17 0000  Call MD for:  difficulty breathing, headache or visual disturbances     05/30/17 1034   05/30/17 0000  Call MD for:  extreme fatigue     05/30/17 1034      Disposition and follow-up:   Mr.Evan Mcdaniel was discharged from [facility] in Good condition.  At the hospital follow up visit please address:  1.  Chest pain. Patient admitted with chest pain. ACS ruled out. Patient expressed frustration that we are not do not have a firm diagnosis of his chest pain. We  reassured him that we have ruled out ACS and that the etiology of his chest pain can continue to be evaluated outpatient. Patient expressed fear regarding what he describes as acute episodes where he gets very hypertensive, has chest pain, and is diaphoretic. Also reports a strong family history of heart disease. Can consider work-up/evaluating for cervical radiculopathy, pheochromocytoma, gallstones, esophagram for prinzmetal's angina. Alternatively, chest pain may be secondary to increased anxiety and stress.  2.  Chronic pain. Patient stated he was out of his chronic pain medications, which he takes for back pain and pain from prostate cancer. We informed him that we are not able to provide him with his chronic medications and that he needs to call or follow up with his primary care provider in order to receive this. Please establish pain medication contract if appropriate.  3.  Anxiety and PTSD: Patient has similar presentations of chest pain in the past surrounding significant personal stress-provoking landmarks. Patient is on venlafaxine and buspirone. States that buspirone is not helpful. Please consider counseling or medication changes if appropriate.  4.  Labs / imaging needed at time of follow-up: None  5.  Pending labs/ test needing follow-up: None  Follow-up Appointments: Follow-up Information    Sanchez-Brugal, Myer Peer, MD Follow up.   Specialty:  Internal Medicine Contact information: [address] [place] [address] [phone]           Hospital Course by problem list:  1. Chest pain, possibly secondary to anxiety and stress Patient admitted 8/26 with constant severe left sided chest pain radiating to left neck. Not relieved with NTG or other pain medications. Troponins negative. EKG negative. Has had previous negative ischemic work-up including LHC in 09/2016 at [facility] which showed 0 coronary disease and negative stress test. Patient also tells [place] that it was the 4th  anniversary of his daughter's death. Patient has presented several times in the past with similar symptoms at significant landmarks, including in June 2018 when his daughter's murderer was sentenced. He asks for his home pain medications to help with his pain, which he states he normally gets monthly from his oncologist. He reports some improvement of his chest pain with home pain medications, which include roxicodone and oxycontin. Patient expressed frustration that the etiology of his chest pain is unknown. We provided reassurance that we have ruled out ACS and that his chest pain is something that can be further evaluated outpatient with his primary care doctor. If appropriate, consider further work-up for cervical radiculopathy, pheochromocytoma, gallstones, or esophagram for prinzmetal's angina.  2. Chronic back pain  and pain from prostate cancer Patient stated he had run out of his home pain medications, oxycontin and roxicodone. We informed him that we are not able to provide him with chronic opioids and that he will need to follow up with his outpatient provider. He stated that he used to receive pain medication from Dr. Arvella Nigh, but he is now moving to [facility] and establishing care with Dr. Shawnie Pons (sp?) with whom he has had a consultation. He does not have another appointment with Dr. Shawnie Pons until the end of September and thus cannot get his medications. We informed him that we are not able to provide him with his chronic pain medications and that he will need to call his PCP in order to get these prescriptions and to set up an appointment.  3. Syncope x2 without LOC Patient states he had an episode of syncope where he went to stand up and then passed out. Did not lose consciousness. Suspect this was due to orthostatic hypotension. The next morning, he had another syncopal episode while urinating. Caught by his kids. Likely situational syncope from increased emotional stress. No further  episodes of syncope while in the hospital.  4. HTN  BP elevated on admission. Restarted patient's home amlodipine, clonidine, lisinopril, HCTZ, and nebivolol with improvement in BP during his hospitalization.  5. PTSD Patient on buspirone and venlafaxine. States buspirone is not helpful at all.  Discharge Vitals:   BP 123/73 (BP Location: Right Arm)   Pulse (!) 58   Temp 98.2 F (36.8 C) (Oral)   Resp 17   Ht 5\' 11"  (1.803 m)   Wt 198 lb (89.8 kg)   SpO2 100%   BMI 27.62 kg/m   Pertinent Labs, Studies, and Procedures:  CBC Latest Ref Rng & Units 05/28/2017 05/01/2017 03/30/2017  WBC 4.0 - 10.5 K/uL 5.7 5.2 7.2  Hemoglobin 13.0 - 17.0 g/dL 12.8(L) 12.9(L) 12.1(L)  Hematocrit 39.0 - 52.0 % 38.9(L) 38.2(L) 38.0(L)  Platelets 150 - 400 K/uL 201 175 183   BMP Latest Ref Rng & Units 05/28/2017 05/01/2017 03/30/2017  Glucose 65 - 99 mg/dL 145(H) 171(H) 106(H)  BUN 6 - 20 mg/dL 9 10 10   Creatinine 0.61 - 1.24 mg/dL 0.87 0.86 0.79  Sodium 135 - 145 mmol/L 137 136 140  Potassium 3.5 - 5.1 mmol/L 3.2(L) 3.4(L) 4.2  Chloride 101 - 111 mmol/L 103 103 103  CO2 22 - 32 mmol/L 22 22 29   Calcium 8.9 - 10.3 mg/dL 9.5 9.5 9.1   UA with 30 protein Trop neg x3 UDS positive for opiates and THC  Discharge Instructions: Discharge Instructions    Call MD for:  difficulty breathing, headache or visual disturbances    Complete by:  As directed    Call MD for:  extreme fatigue    Complete by:  As directed    Call MD for:  persistant dizziness or light-headedness    Complete by:  As directed    Call MD for:  severe uncontrolled pain    Complete by:  As directed    Call MD for:  temperature >100.4    Complete by:  As directed    Diet - low sodium heart healthy    Complete by:  As directed    Increase activity slowly    Complete by:  As directed      Signed: Colbert Ewing, MD 05/30/2017, 3:20 PM   P [phone]

## 2017-05-29 NOTE — Progress Notes (Signed)
Subjective:  No acute events overnight. Evan Mcdaniel was seen sitting up in bed this morning. He continues to endorse severe left-sided chest pain radiating up to his neck. He states that his home medications helped a little bit. Patient states that he was previously getting his pain medication from the oncologist caring for his prostate cancer. Is now moving to [facility] and transitioning to care with Dr. Mickle Asper (sp?) in [place]. He had a consultation with him but is not scheduled to see him again until the end of September. We informed that patient that we are not able to provide him with his chronic pain medication on discharge and he will need to follow up with his PCP doctor in order to obtain a prescription. Work-up thus far has been negative and we feel patient is medically stable to go home. Patient stated that his sister is not able to pick him up today and is only available tomorrow morning because she is busy at work.  Objective:  Vital signs in last 24 hours: Vitals:   05/29/17 0425 05/29/17 0747 05/29/17 1009 05/29/17 1300  BP: (!) 151/92  (!) 149/94   Pulse:  63  64  Resp:  18  17  Temp: 97.8 F (36.6 C) 98 F (36.7 C)  98.2 F (36.8 C)  TempSrc: Oral Oral  Oral  SpO2: 97% 98%  99%  Weight:      Height:       GEN: AA male sitting up in bed comfortably in NAD; alert and oriented RESP: Clear to auscultation bilaterally. No wheezes, rales, or rhonchi. No increased work of breathing. CV: Normal rate and regular rhythm. No murmurs, gallops, or rubs. No tenderness to palpation.  Troponin negative x3  Assessment/Plan:  Active Problems:   Chest pain  Evan Mcdaniel is a 56yo male with PMH significant for HTN, DM, PTSD, chronic back pain, and prostate cancer (currently receiving radiation therapy every 2 weeks) who presents with acute onset severe, "sharp stabbing" chest pain, likely secondary to anxiety and stress. Continues to endorse chest pain this morning, states his home  pain regimen of oxycontin and roxicodone are somewhat helpful.  # Chest pain Likely secondary to anxiety and stress. Work-up thus far has been negative. Troponins negative. He states that he was getting his pain medication from his oncologist but is now transitioning his care to Dr. Mickle Asper (sp?) in [place]. He had a consultation but does not have another appointment scheduled with him until the end of September. We informed him that we cannot provide him with his chronic pain medications and that he will need to speak to his PCP in order to get these prescriptions. He states that he will call his PCP. Patient is stable for discharge from a medical standpoint, however patient states that his sister cannot come to pick him up until tomorrow morning. - repeat EKG tomorrow AM - continue aspirin - home oxycodone, oxycontin for pain management - SL NTG as needed - continue home buspirone, venlafaxine, and xanax for anxiety - discharge home tomorrow  # Syncope x2 without LOC First episode last night sounds like it may have been orthostatic. Patient went to stand up and then passed out. Did not lose consciousness. This morning, he states he was urinating and passed out. Caught by his kids. Likely situational syncope. - continue to monitor  # HTN sBP in 110s-140s in the hospital. Resuming his home BP medications - Continue to monitor BP - amlodipine 10mg  daily - clonidine 0.1mg   TID - lisinopril 20mg  BID - HCTZ 12.5mg  BID - nebivolol 10mg  daily  # DM - lantus 15u BID - SSI - CBG  # HLD  - simvastatin  Diet: diabetic diet VTE PPx: Lovenox  Dispo: Anticipated discharge in approximately 1 day.  Evan Ewing, MD 05/29/2017, 2:56 PM Pager: Mamie Nick [phone]

## 2017-05-30 DIAGNOSIS — Z794 Long term (current) use of insulin: Secondary | ICD-10-CM | POA: Diagnosis not present

## 2017-05-30 DIAGNOSIS — R0789 Other chest pain: Secondary | ICD-10-CM | POA: Diagnosis not present

## 2017-05-30 DIAGNOSIS — I160 Hypertensive urgency: Secondary | ICD-10-CM | POA: Diagnosis not present

## 2017-05-30 DIAGNOSIS — E119 Type 2 diabetes mellitus without complications: Secondary | ICD-10-CM | POA: Diagnosis not present

## 2017-05-30 LAB — GLUCOSE, CAPILLARY
GLUCOSE-CAPILLARY: 122 mg/dL — AB (ref 65–99)
GLUCOSE-CAPILLARY: 148 mg/dL — AB (ref 65–99)

## 2017-05-30 NOTE — Progress Notes (Signed)
Subjective:  No acute events overnight. Mr. Brookens states that he continues to have chest pain radiating up to his neck.   We had a long discussion with him this morning regarding further work-up of his chest pain. He expressed frustration that no one can figure out what is wrong with him and that people do not believe that he ha pain. He expressed fear about cardiac issues due to his strong family history of cardiac disease. We re-inforced that we have ruled out intrinsic cardiac disease as the cause of his chest pain but that we do not doubt that he has pain. However, because the acute causes that could kill him have been ruled out during his hospital stay, the etiology of his chest pain can be further evaluated outpatient with his established primary care doctor. He stated that he is about to switch his PCP to someone in [place] where he is moving to. He continued to express frustration and anger that he was not being treated for his chest pain and that he wants to leave the hospital. We provided reassurance that our work-up thus far has been negative and that we feel the patient is medically stable to go home today with follow-up with his PCP.  Objective:  Vital signs in last 24 hours: Vitals:   05/29/17 1947 05/29/17 2127 05/29/17 2300 05/30/17 0603  BP: 121/89 (!) 113/102 105/66 123/73  Pulse: 64  (!) 57 (!) 58  Resp: 17  16 17   Temp: 98.1 F (36.7 C)  97.7 F (36.5 C) 98.2 F (36.8 C)  TempSrc: Oral  Oral Oral  SpO2: 98%  99% 100%  Weight:    198 lb (89.8 kg)  Height:       GEN: AA male sitting up in bed comfortably in NAD; alert and oriented; anxious and frustrated; raising voice during the conversation CV: Normal rate and regular rhythm. No murmurs, gallops, or rubs. Could not complete the remainder of the physical exam because patient did not want to be examined  Assessment/Plan:  Active Problems:   Chest pain  Mr. Lesko is a 56yo male with PMH significant for HTN, DM,  PTSD, chronic back pain, and prostate cancer (currently receiving radiation therapy every 2 weeks) who presents with acute onset severe, "sharp stabbing" chest pain, likely secondary to anxiety and stress. Continues to endorse chest pain this morning and frustration of everything that he is going through.  # Chest pain Likely secondary to anxiety and stress. Work-up thus far has been negative. He expressed frustration today regarding how difficult it is to deal with the chest pain every day and that "it's not about the pain medications". He has to care for his grandson and has had multiple tragedies in the family, which make it more difficult for him to deal with this pain on a day-to-day basis. He understands that we have ruled out intrinsic cardiac disease as the cause of his chest pain and that further work-up of his chest pain should be done on an outpatient basis with his primary care doctor. He continued to express frustration about his chest pain and stressors in life and informed us that he wanted to leave the hospital. - continue aspirin - continue home medications - discharge home today  # Syncope x2 without LOC First episode last night sounds like it may have been orthostatic. Patient went to stand up and then passed out. Did not lose consciousness. This morning, he states he was urinating and passed out. Caught  by his kids. Likely situational syncope. - continue to monitor  # HTN sBP in 110s-140s in the hospital. Resuming his home BP medications - Continue to monitor BP - amlodipine 10mg  daily - clonidine 0.1mg  TID - lisinopril 20mg  BID - HCTZ 12.5mg  BID - nebivolol 10mg  daily  # DM - lantus 15u BID - SSI - CBG  # HLD  - simvastatin  Diet: diabetic diet VTE PPx: Lovenox  Dispo: Anticipated discharge today.  Colbert Ewing, MD 05/30/2017, 10:34 AM Pager: Mamie Nick [phone]

## 2017-05-30 NOTE — Progress Notes (Signed)
Patient refusing to wear tele monitor and removed it himself. Patient educated. CCMD notified/

## 2017-05-30 NOTE — Progress Notes (Addendum)
Nursing requested CSW assistance with transportation for patient. CSW met with patient and family at bedside. Patient indicated he and his family were staying at [facility] in [place] but are from [place]. Patient indicated a family member would pick him and his family up later this evening, but he wanted to go to the hotel to collect his things. CSW discussed transport options and patient was agreeable to accept bus passes. CSW provided two bus passes and a meal voucher for patient, as he and family planned to wait here at hospital for pick up after collecting his things. CSW gave bus passes and voucher to nurse to provide to patient with discharge paperwork.  CSW informed by [facility] that patient contacted patient experience and reported he was unable to get to hotel by bus and requested a taxi voucher. CSW consulted with patient experience and confirmed patient would need a taxi to reach his destination. CSW provided taxi voucher to patient experience representative.  CSW signing off.  Estanislado Emms, [place]

## 2017-05-30 NOTE — Discharge Instructions (Signed)
Evan Mcdaniel,  Please follow up with your outpatient primary care doctor regarding your chest pain for further work-up.

## 2017-11-29 IMAGING — MR MR CERVICAL SPINE WO/W CM
7 of 18 series · 18 of 48 positions shown · IV contrast (multihance)
Comparison: None.

CLINICAL DATA: 55 y/o M; prostate cancer with left upper extremity
weakness and numbness.

EXAM:
MRI CERVICAL AND THORACIC SPINE WITHOUT AND WITH CONTRAST
TECHNIQUE: Multiplanar and multiecho pulse sequences of the cervical spine, to
include the craniocervical junction and cervicothoracic junction,
and thoracic spine, were obtained without and with intravenous
contrast.
CONTRAST:  19mL MULTIHANCE GADOBENATE DIMEGLUMINE 529 MG/ML IV SOLN

[Series 2: T2 · sagittal · 3.0mm · 0.43mm/px · 1 of 13 slices shown (1 of 4)]
[im 1/13]
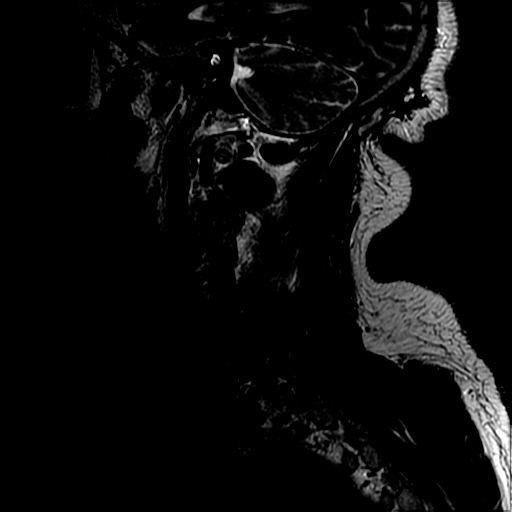

[Series 3: T1 · sagittal · 3.0mm · 0.43mm/px · 1 of 13 slices shown (1 of 3)]
[im 1/13]
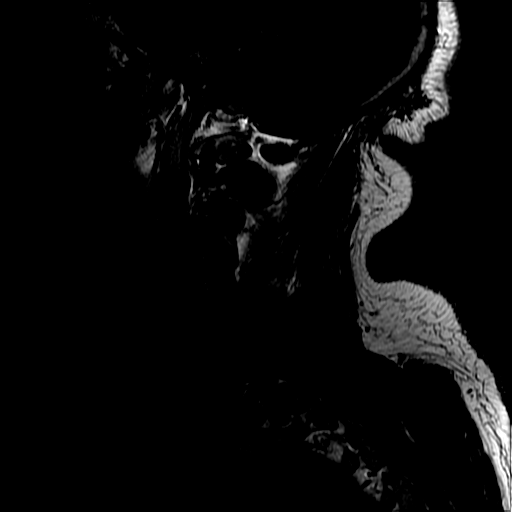

[Series 5: T2 · axial · 3.0mm · 0.39mm/px · z∈[-51,+49]mm · 3 of 30 slices shown (2 of 4)]
[im 1/30]
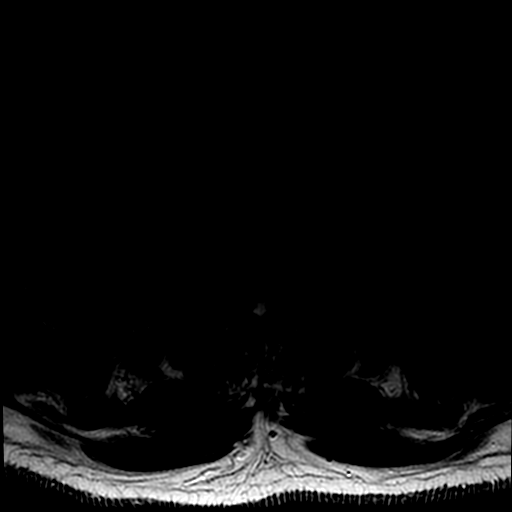
[im 15/30]
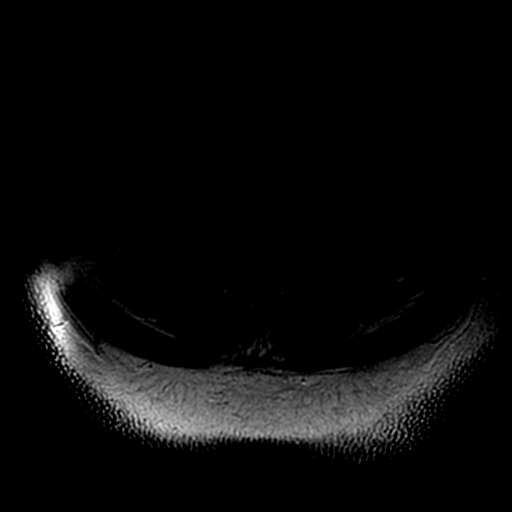
[im 30/30]
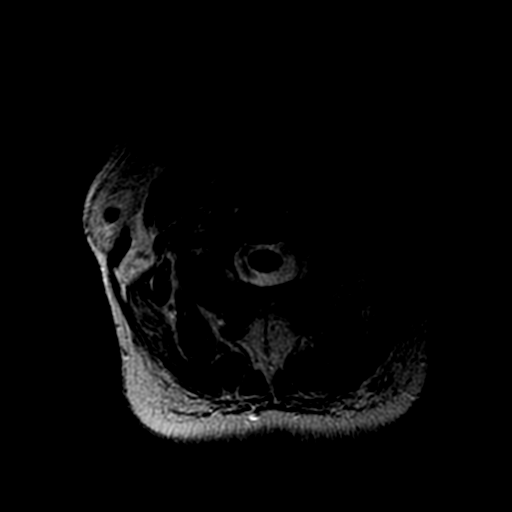

[Series 8: T1 · axial · non-contrast · 3.0mm · 0.39mm/px · z∈[-51,+49]mm · 4 of 30 slices shown (2 of 3)]
[im 1/30]
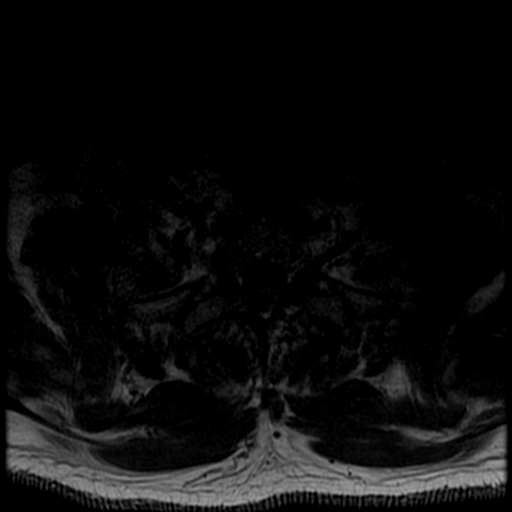
[im 10/30]
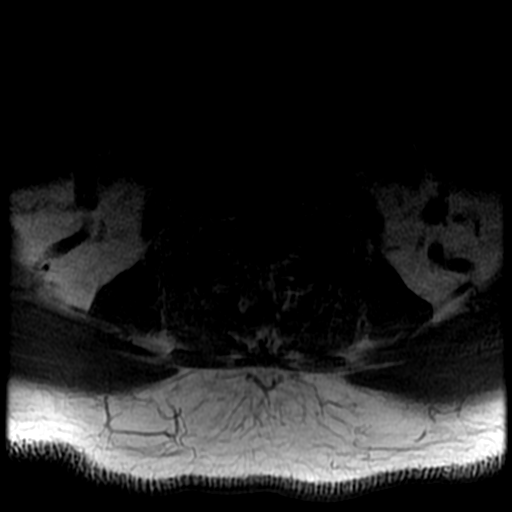
[im 20/30]
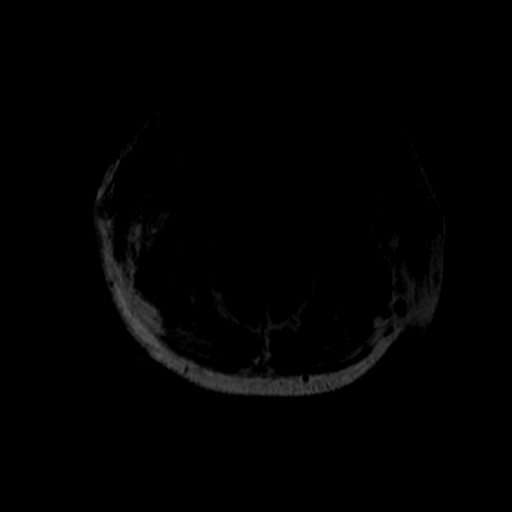
[im 30/30]
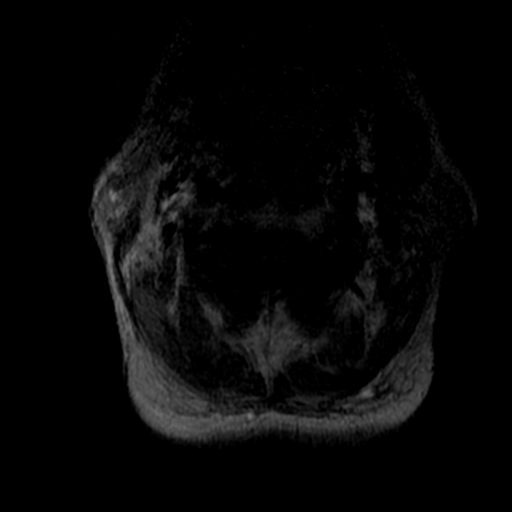

[Series 12: T2 · sagittal · 3.0mm · 0.62mm/px · 2 of 12 slices shown (3 of 4)]
[im 1/12]
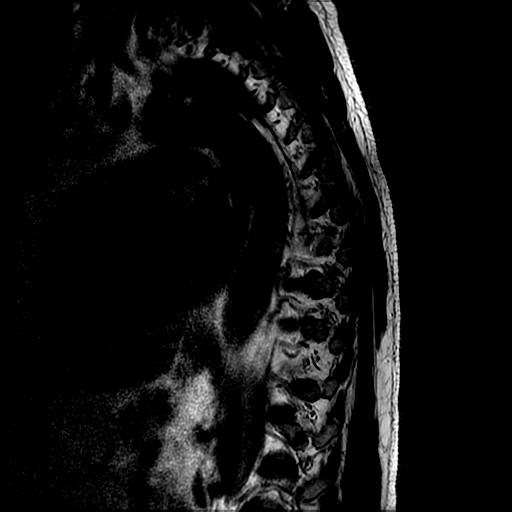
[im 12/12]
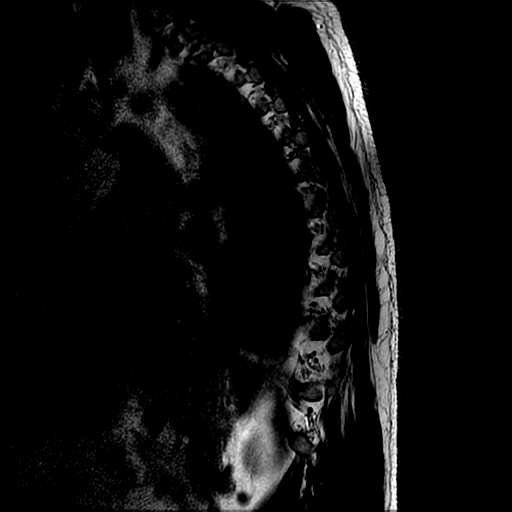

[Series 13: T1 · sagittal · 3.0mm · 0.62mm/px · 2 of 12 slices shown (3 of 3)]
[im 1/12]
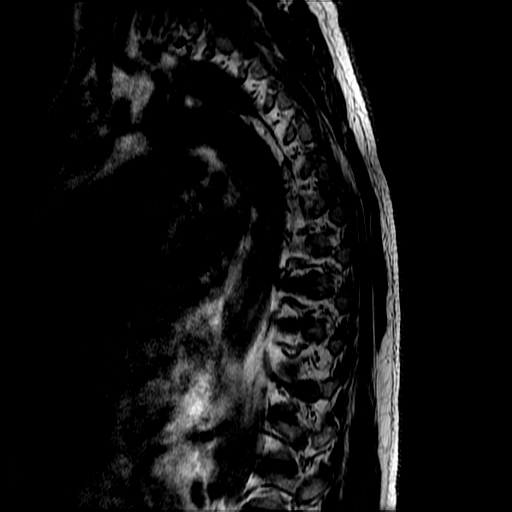
[im 12/12]
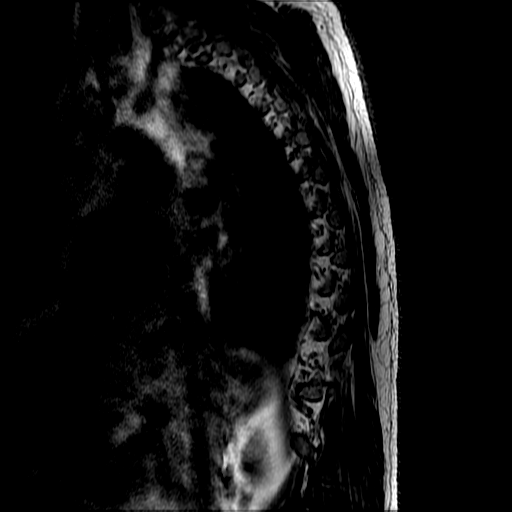

[Series 15: T2 · axial · 4.0mm · 0.43mm/px · z∈[-282,-79]mm · 5 of 39 slices shown (4 of 4)]
[im 1/39]
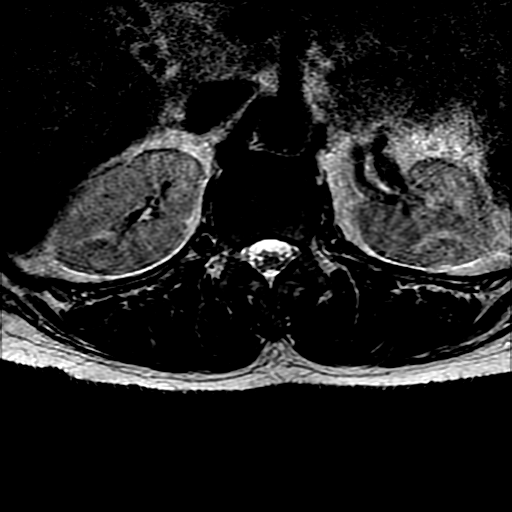
[im 10/39]
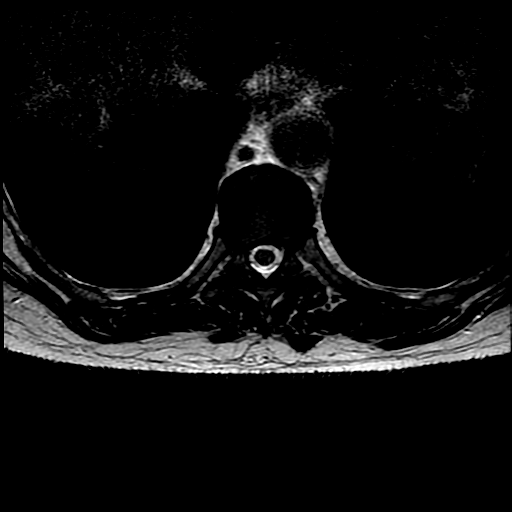
[im 20/39]
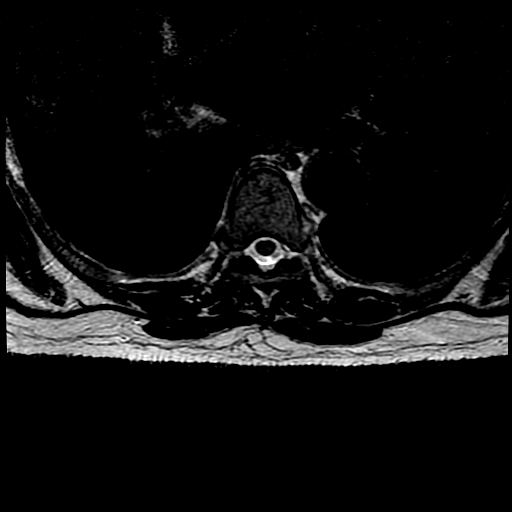
[im 29/39]
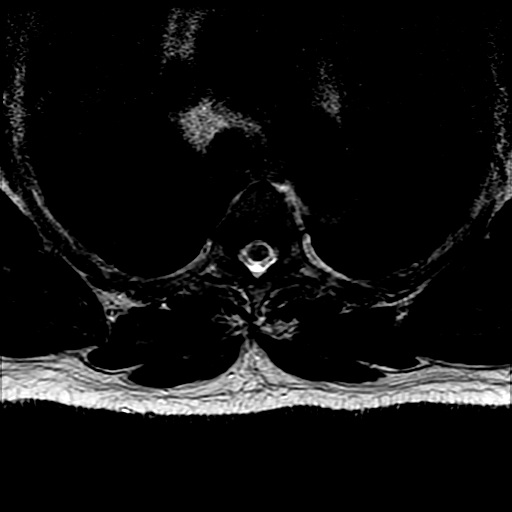
[im 39/39]
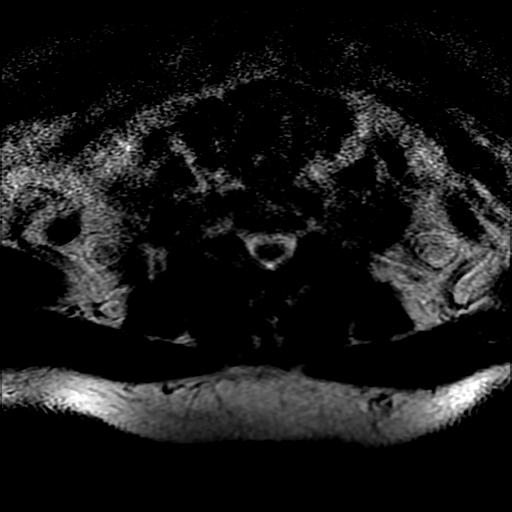

[18 of 48 positions shown; findings below may reference images not displayed]

FINDINGS: MRI CERVICAL SPINE FINDINGS

Alignment: Straightening of cervical lordosis.  No listhesis.

Vertebrae: No fracture, evidence of discitis, or bone lesion. No
abnormal enhancement.

Cord: Normal signal and morphology.  No abnormal enhancement.

Posterior Fossa, vertebral arteries, paraspinal tissues: Negative.

Disc levels:

C2-3: Left-greater-than-right uncovertebral and facet hypertrophy
with mild right and severe left foraminal narrowing. No significant
canal stenosis.

C3-4: Left-sided uncovertebral and facet hypertrophy and small disc
bulge with mild left-sided foraminal narrowing. Ventral thecal sac
effacement and mild canal stenosis.

C4-5: Bilateral uncovertebral and facet hypertrophy with mild
bilateral foraminal narrowing. No significant canal stenosis.

C5-6: Bilateral uncovertebral and facet hypertrophy with moderate
bilateral foraminal narrowing. No significant canal stenosis.

C6-7: Left-sided uncovertebral and facet hypertrophy with mild left
foraminal narrowing. No significant canal stenosis.

C7-T1: No significant disc displacement, foraminal narrowing, or
canal stenosis.

MRI THORACIC SPINE FINDINGS

Alignment: Physiologic.

Vertebrae: No fracture, evidence of discitis, or bone lesion. No
abnormal enhancement. Minimal superior endplate degenerative edema
at T8-T9.

Cord: Normal signal and morphology.  No abnormal enhancement.

Posterior Fossa, vertebral arteries, paraspinal tissues: Negative.

Disc levels:

Mild multilevel discogenic degenerative changes with disc
desiccation and multilevel mild disc space narrowing. At T8-9 there
is a tiny central disc protrusion and annular fissure. No
significant foraminal narrowing or canal stenosis.
IMPRESSION: Cervical spine:

1. No bony metastatic disease identified. No abnormal cord signal or
enhancement.
2. Mild C4-5 multifactorial canal stenosis. No high-grade canal
stenosis.
3. Multilevel mild foraminal narrowing with severe left C2-3 and
moderate bilateral C5-6 foraminal narrowing.
Thoracic spine:

1. No bony metastatic disease identified. No abnormal cord signal or
enhancement.
2. Mild thoracic spine spondylosis. No significant foraminal
narrowing or canal stenosis.

By: Djapo Ur M.D.

## 2018-06-01 ENCOUNTER — Telehealth: Payer: Self-pay | Admitting: Diagnostic Neuroimaging

## 2018-06-01 ENCOUNTER — Ambulatory Visit: Payer: Medicaid Other | Admitting: Diagnostic Neuroimaging

## 2018-06-01 ENCOUNTER — Encounter: Payer: Self-pay | Admitting: Diagnostic Neuroimaging

## 2018-06-01 VITALS — BP 122/86 | HR 70 | Ht 71.0 in | Wt 204.0 lb

## 2018-06-01 DIAGNOSIS — M5442 Lumbago with sciatica, left side: Secondary | ICD-10-CM | POA: Diagnosis not present

## 2018-06-01 DIAGNOSIS — G43109 Migraine with aura, not intractable, without status migrainosus: Secondary | ICD-10-CM

## 2018-06-01 DIAGNOSIS — G8929 Other chronic pain: Secondary | ICD-10-CM

## 2018-06-01 DIAGNOSIS — G40909 Epilepsy, unspecified, not intractable, without status epilepticus: Secondary | ICD-10-CM | POA: Diagnosis not present

## 2018-06-01 MED ORDER — TOPIRAMATE 50 MG PO TABS: 50.0000 mg | ORAL_TABLET | Freq: Two times a day (BID) | ORAL | 12 refills | Status: AC

## 2018-06-01 NOTE — Progress Notes (Signed)
[facility]  PATIENT: Evan Mcdaniel DOB: April 30, 1961  REFERRING CLINICIAN: E Dean HISTORY FROM: patient  REASON FOR VISIT: new consult    HISTORICAL  CHIEF COMPLAINT:  Chief Complaint  Patient presents with  . New Patient (Initial Visit)    Here alone room 6  . Migraine    Patient reports constant headaches   . Back Pain    Patinet reports constant back pain, patient stated last saturday 05/26/2018 his back pain was so bad he passed out and hit his head on the floor.     HISTORY OF PRESENT ILLNESS:   57 year old male here for evaluation of seizure.  Age 89 years old patient passed out, had convulsions, bit his tongue, was diagnosed with seizure and started on Dilantin.  Patient was seeing a neurologist in [facility].  When patient moved to [facility], apparently he established with a PCP who recommended patient stop Dilantin since he had been seizure free for 2 years.   05/19/2018 patient had severe headache, nausea, vomiting, then had a seizure.  Patient was restarted on Dilantin and then discharged.  Patient also has history of migraine headaches, previous on topiramate, associated with global severe headache, nausea and photophobia.    REVIEW OF SYSTEMS: Full 14 system review of systems performed and negative with exception of: Coronary artery disease hypertension diabetes migraine depression anxiety prostate cancer.  Memory loss confusion headache seizure insomnia weight loss fatigue eye pain chest pain incontinence cramps pain.   ALLERGIES: Allergies  Allergen Reactions  . Naproxen Hives  . Vicodin [Hydrocodone-Acetaminophen] Hives    HOME MEDICATIONS: Outpatient Medications Prior to Visit  Medication Sig Dispense Refill  . albuterol (PROVENTIL HFA;VENTOLIN HFA) 108 (90 Base) MCG/ACT inhaler Inhale 2 puffs into the lungs every 6 (six) hours as needed for wheezing or shortness of breath.    . ALPRAZolam (XANAX) 1 MG  tablet Take 1 tablet (1 mg total) by mouth 3 (three) times daily. 15 tablet 0  . amLODipine (NORVASC) 10 MG tablet Take 1 tablet (10 mg total) by mouth daily. 20 tablet 0  . aspirin EC 81 MG tablet Take 81 mg by mouth daily.     . busPIRone (BUSPAR) 7.5 MG tablet Take 7.5 mg by mouth 3 (three) times daily.  0  . BYSTOLIC 10 MG tablet Take 10 mg by mouth daily.  0  . cloNIDine (CATAPRES) 0.1 MG tablet Take 0.1 mg by mouth 3 (three) times daily.   1  . diphenhydrAMINE (BENADRYL) 25 MG tablet Take 50 mg by mouth every 6 (six) hours as needed for allergies.    . famotidine (PEPCID) 10 MG tablet Take 10 mg by mouth daily.  0  . LANTUS SOLOSTAR 100 UNIT/ML Solostar Pen Inject 20 Units into the skin 2 (two) times daily.   0  . lisinopril-hydrochlorothiazide (PRINZIDE,ZESTORETIC) 20-12.5 MG tablet Take 2 tablets by mouth 2 (two) times daily.     . metFORMIN (GLUCOPHAGE) 1000 MG tablet Take 1 tablet (1,000 mg total) by mouth 2 (two) times daily with a meal. 20 tablet 0  . metoprolol succinate (TOPROL-XL) 50 MG 24 hr tablet Take 50 mg by mouth daily. Take with or immediately following a meal.    . nitroGLYCERIN (NITROSTAT) 0.4 MG SL tablet Place 0.4 mg under the tongue every 5 (five) minutes as needed for chest pain.    Marland Kitchen oxyCODONE (OXYCONTIN) 10 mg 12 hr tablet Take 1 tablet (10 mg total) by mouth 3 (three)  times daily. 14 tablet 0  . oxyCODONE (ROXICODONE) 15 MG immediate release tablet Take 1 tablet (15 mg total) by mouth 2 (two) times daily as needed for pain. (Patient taking differently: Take 15 mg by mouth 3 (three) times daily. ) 10 tablet 0  . phenytoin (DILANTIN) 100 MG ER capsule Take 100 mg by mouth 2 (two) times daily.     . simvastatin (ZOCOR) 10 MG tablet Take 10 mg by mouth daily at 6 PM.    . sitaGLIPtin (JANUVIA) 50 MG tablet Take 50 mg by mouth daily.    Marland Kitchen venlafaxine (EFFEXOR) 75 MG tablet Take 3 tablets (225 mg total) by mouth daily. 20 tablet 0  . Vitamin D, Ergocalciferol, (DRISDOL)  50000 units CAPS capsule Take 50,000 Units by mouth every Monday.     No facility-administered medications prior to visit.     PAST MEDICAL HISTORY: Past Medical History:  Diagnosis Date  . Anginal pain ([place])   . Asthma   . Chronic back pain    "all over my back" (05/29/2017)  . Chronic pain   . GERD (gastroesophageal reflux disease)   . Heart attack ([place])   . Hypertension   . Migraine   . Prostate cancer ([place]) 2017  . PTSD (post-traumatic stress disorder)    "military related"  . Seizures ([place])    "have them when I don't take my RX" (05/29/2017)  . Sleep apnea    "real bad; won't wear mask" (05/29/2017)  . Type II diabetes mellitus ([place])   . Vitamin D deficiency     PAST SURGICAL HISTORY: Past Surgical History:  Procedure Laterality Date  . ABDOMINAL HERNIA REPAIR  05/2016  . BACK SURGERY  2012  . CARDIAC CATHETERIZATION  09/2016   showing normal cors.   Marland Kitchen FINGER FRACTURE SURGERY Left 2016   4th digit; "it's got a pin in it"  . FRACTURE SURGERY    . HERNIA REPAIR    . [place] SURGERY  2012  . PROSTATE BIOPSY  2017  . PROSTATECTOMY  05/2016  . THROAT SURGERY  1966   "for bad strept"  . TONGUE SURGERY  1983   "related to military"    FAMILY HISTORY: Family History  Problem Relation Age of Onset  . Diabetes Mother   . Heart attack Mother   . Heart attack Father 68  . Prostate cancer Father   . Stroke Father   . Heart attack Sister   . Heart attack Brother 3  . Heart attack Brother   . Multiple sclerosis Sister     SOCIAL HISTORY: Social History   Socioeconomic History  . Marital status: Married    Spouse name: Sullivan Lone  . Number of children: Not on file  . Years of education: Not on file  . Highest education level: Some college, no degree  Occupational History    Comment: Retired-[employer]   Social Needs  . Financial resource strain: Not on file  . Food insecurity:    Worry: Not on file    Inability: Not on file  . Transportation needs:     Medical: Not on file    Non-medical: Not on file  Tobacco Use  . Smoking status: Former Smoker    Packs/day: 0.25    Years: 10.00    Pack years: 2.50    Types: Cigarettes    Last attempt to quit: 10/03/2013    Years since quitting: 4.6  . Smokeless tobacco: Never Used  Substance and Sexual Activity  .  Alcohol use: No  . Drug use: No  . Sexual activity: Never  Lifestyle  . Physical activity:    Days per week: Not on file    Minutes per session: Not on file  . Stress: Not on file  Relationships  . Social connections:    Talks on phone: Not on file    Gets together: Not on file    Attends religious service: Not on file    Active member of club or organization: Not on file    Attends meetings of clubs or organizations: Not on file    Relationship status: Not on file  . Intimate partner violence:    Fear of current or ex partner: Not on file    Emotionally abused: Not on file    Physically abused: Not on file    Forced sexual activity: Not on file  Other Topics Concern  . Not on file  Social History Narrative   Right handed   Lives at home with wife    Caffeine- 1 cup day      PHYSICAL EXAM  GENERAL EXAM/CONSTITUTIONAL: Vitals:  Vitals:   06/01/18 0927  BP: 122/86  Pulse: 70  Weight: 204 lb (92.5 kg)  Height: 5\' 11"  (1.803 m)     Body mass index is 28.45 kg/m. Wt Readings from Last 3 Encounters:  06/01/18 204 lb (92.5 kg)  05/30/17 198 lb (89.8 kg)  03/30/17 203 lb 9.6 oz (92.4 kg)     Patient is in no distress; well developed, nourished and groomed; neck is supple  CARDIOVASCULAR:  Examination of carotid arteries is normal; no carotid bruits  Regular rate and rhythm, no murmurs  Examination of peripheral vascular system by observation and palpation is normal  EYES:  Ophthalmoscopic exam of optic discs and posterior segments is normal; no papilledema or hemorrhages  Visual Acuity Screening   Right eye Left eye Both eyes  Without correction:  20/50 20/70   With correction:        MUSCULOSKELETAL:  Gait, strength, tone, movements noted in Neurologic exam below  NEUROLOGIC: MENTAL STATUS:  No flowsheet data found.  awake, alert, oriented to person, place and time  recent and remote memory intact  normal attention and concentration  language fluent, comprehension intact, naming intact  fund of knowledge appropriate  CRANIAL NERVE:   2nd - no papilledema on fundoscopic exam  2nd, 3rd, 4th, 6th - pupils equal and reactive to light, visual fields full to confrontation, extraocular muscles intact, no nystagmus  5th - facial sensation symmetric  7th - facial strength symmetric  8th - hearing intact  9th - palate elevates symmetrically, uvula midline  11th - shoulder shrug symmetric  12th - tongue protrusion midline  MOTOR:   normal bulk and tone, full strength in the BUE, BLE; EXCEPT LIMITED IN LEFT LEG HIP FLEX DUE TO PAIN  SENSORY:   normal and symmetric to light touch, temperature, vibration  COORDINATION:   finger-nose-finger, fine finger movements normal  REFLEXES:   deep tendon reflexes TRACE and symmetric  GAIT/STATION:   narrow based gait     DIAGNOSTIC DATA (LABS, IMAGING, TESTING) - I reviewed patient records, labs, notes, testing and imaging myself where available.  Lab Results  Component Value Date   WBC 5.7 05/28/2017   HGB 12.8 (L) 05/28/2017   HCT 38.9 (L) 05/28/2017   MCV 86.1 05/28/2017   PLT 201 05/28/2017      Component Value Date/Time   NA 137 05/28/2017 1123  K 3.2 (L) 05/28/2017 1123   CL 103 05/28/2017 1123   CO2 22 05/28/2017 1123   GLUCOSE 145 (H) 05/28/2017 1123   BUN 9 05/28/2017 1123   CREATININE 0.87 05/28/2017 1123   CALCIUM 9.5 05/28/2017 1123   PROT 6.3 (L) 03/29/2017 0534   ALBUMIN 3.6 03/29/2017 0534   AST 15 03/29/2017 0534   ALT 13 (L) 03/29/2017 0534   ALKPHOS 48 03/29/2017 0534   BILITOT 0.4 03/29/2017 0534   GFRNONAA >60 05/28/2017  1123   GFRAA >60 05/28/2017 1123   No results found for: CHOL, HDL, LDLCALC, LDLDIRECT, TRIG, CHOLHDL Lab Results  Component Value Date   HGBA1C 6.2 (H) 03/28/2017   No results found for: VITAMINB12 No results found for: TSH  05/19/18 CT head [report only] - negative   03/15/16 NM bone scan - Scattered degenerative type uptake as above. - No definite scintigraphic evidence of osseous metastatic disease from prostate cancer.   ASSESSMENT AND PLAN  57 y.o. year old male here with:  Dx:  1. Seizure disorder ([place])   2. Migraine with aura and without status migrainosus, not intractable     PLAN:  SEIZURE DISORDER (last sz 05/19/18) - check MRI brain  - check EEG - continue phenytoin 100mg  three times a day - start topiramate --> 50mg  twice a day; then increase to 100mg  twice a day (to help with migraine and seizure prevention) - According to [place] law, you can not drive unless you are seizure / syncope free for at least 6 months and under physician's care.  - Please maintain precautions. Do not participate in activities where a loss of awareness could harm you or someone else. No swimming alone, no tub bathing, no hot tubs, no driving, no operating motorized vehicles (cars, ATVs, motocycles, etc), lawnmowers, power tools or firearms. No standing at heights, such as rooftops, ladders or stairs. Avoid hot objects such as stoves, heaters, open fires. Wear a helmet when riding a bicycle, scooter, skateboard, etc. and avoid areas of traffic. Set your water heater to 120 degrees or less.  MIGRAINE WITH AURA - topiramate + ibuprofen / tylenol - cannot use triptans due to coronary artery disease (and stents)  BACK PAIN - check MRI lumbar spine (rule out bone mets) - may need referral to pain mgmt clinic  Orders Placed This Encounter  Procedures  . MR BRAIN W WO CONTRAST  . MR Lumbar Spine W Wo Contrast  . EEG adult   Meds ordered this encounter  Medications  . topiramate (TOPAMAX)  50 MG tablet    Sig: Take 1-2 tablets (50-100 mg total) by mouth 2 (two) times daily.    Dispense:  60 tablet    Refill:  12  . phenytoin (DILANTIN) 100 MG ER capsule    Sig: Take 1 capsule (100 mg total) by mouth 3 (three) times daily.    Dispense:  90 capsule    Refill:  12   Return in about 6 months (around 12/01/2018).  I reviewed images, labs, notes, records myself. I summarized findings and reviewed with patient, for this high risk condition (seizure disorder) requiring high complexity decision making.     Penni Bombard, MD 9/79/8921, 1:94 AM Certified in Neurology, Neurophysiology and Neuroimaging  [facility] [address] [place], [place] [address] [phone]

## 2018-06-01 NOTE — Telephone Encounter (Signed)
Medicaid order sent to GI. They will obtain the auth and contact the pt to schedule.

## 2018-06-01 NOTE — Patient Instructions (Signed)
  SEIZURE DISORDER - check MRI brain  - check EEG - start topiramate --> 50mg  twice a day; then increase to 100mg  twice a day (to help with migraine and seizure prevention) - According to [place] law, you can not drive unless you are seizure / syncope free for at least 6 months and under physician's care.  - Please maintain precautions. Do not participate in activities where a loss of awareness could harm you or someone else. No swimming alone, no tub bathing, no hot tubs, no driving, no operating motorized vehicles (cars, ATVs, motocycles, etc), lawnmowers, power tools or firearms. No standing at heights, such as rooftops, ladders or stairs. Avoid hot objects such as stoves, heaters, open fires. Wear a helmet when riding a bicycle, scooter, skateboard, etc. and avoid areas of traffic. Set your water heater to 120 degrees or less.  MIGRAINE WITH AURA - topiramate 50mg  twice a day --> then increase to 100mg  twice a day after 2 weeks - ibuprofen / tylenol as needed  BACK PAIN - check MRI lumbar spine - may need referral to pain mgmt clinic

## 2018-06-05 MED ORDER — PHENYTOIN SODIUM EXTENDED 100 MG PO CAPS: 100.0000 mg | ORAL_CAPSULE | Freq: Three times a day (TID) | ORAL | 12 refills | Status: AC

## 2018-06-06 ENCOUNTER — Other Ambulatory Visit: Payer: Medicaid Other

## 2018-06-07 ENCOUNTER — Encounter: Payer: Self-pay | Admitting: Diagnostic Neuroimaging

## 2018-06-20 ENCOUNTER — Other Ambulatory Visit: Payer: Medicaid Other

## 2018-06-21 ENCOUNTER — Encounter: Payer: Self-pay | Admitting: Diagnostic Neuroimaging

## 2018-06-26 ENCOUNTER — Other Ambulatory Visit: Payer: Medicaid Other

## 2018-06-27 ENCOUNTER — Other Ambulatory Visit: Payer: Self-pay | Admitting: *Deleted

## 2018-06-27 ENCOUNTER — Telehealth: Payer: Self-pay | Admitting: Diagnostic Neuroimaging

## 2018-06-27 NOTE — Telephone Encounter (Signed)
FYI- patient has no-showed 3 times in 2019.

## 2018-06-27 NOTE — Telephone Encounter (Signed)
Pt last seen with Dr. Leta Baptist 06-01-2018, next appt is one yr 2020 scheduled.  3 no shows for EEG.

## 2018-06-28 ENCOUNTER — Encounter: Payer: Self-pay | Admitting: *Deleted

## 2018-06-28 NOTE — Telephone Encounter (Signed)
Attempted to call pt mobile NIS, and home # restricted/ navailable about his not coming to EEG appt.  Looks like MRI approved to have done by medicaid.  Pt to call and schedule.  I mailed letter to pt to call us back.

## 2018-12-11 ENCOUNTER — Ambulatory Visit: Payer: Medicaid Other | Admitting: Diagnostic Neuroimaging

## 2018-12-11 ENCOUNTER — Telehealth: Payer: Self-pay | Admitting: *Deleted

## 2018-12-11 NOTE — Telephone Encounter (Signed)
Patient was no show for follow up today. 

## 2018-12-12 ENCOUNTER — Encounter: Payer: Self-pay | Admitting: Diagnostic Neuroimaging

## 2023-01-31 ENCOUNTER — Other Ambulatory Visit: Payer: Self-pay | Admitting: Student

## 2023-01-31 DIAGNOSIS — E119 Type 2 diabetes mellitus without complications: Secondary | ICD-10-CM

## 2023-03-30 ENCOUNTER — Other Ambulatory Visit: Payer: Self-pay | Admitting: Student

## 2023-03-30 DIAGNOSIS — F4310 Post-traumatic stress disorder, unspecified: Secondary | ICD-10-CM

## 2023-04-04 ENCOUNTER — Other Ambulatory Visit: Payer: Self-pay | Admitting: Student

## 2023-04-07 ENCOUNTER — Other Ambulatory Visit: Payer: Self-pay | Admitting: Student

## 2023-05-29 ENCOUNTER — Other Ambulatory Visit: Payer: Self-pay | Admitting: Student

## 2023-05-29 DIAGNOSIS — Z760 Encounter for issue of repeat prescription: Secondary | ICD-10-CM

## 2023-05-29 DIAGNOSIS — I10 Essential (primary) hypertension: Secondary | ICD-10-CM

## 2023-06-22 ENCOUNTER — Other Ambulatory Visit: Payer: Self-pay | Admitting: Student

## 2023-06-22 DIAGNOSIS — R1031 Right lower quadrant pain: Secondary | ICD-10-CM

## 2023-06-30 ENCOUNTER — Other Ambulatory Visit: Payer: Self-pay | Admitting: Student

## 2023-06-30 DIAGNOSIS — R569 Unspecified convulsions: Secondary | ICD-10-CM

## 2023-11-26 ENCOUNTER — Other Ambulatory Visit: Payer: Self-pay | Admitting: Student

## 2023-12-04 ENCOUNTER — Other Ambulatory Visit: Payer: Self-pay | Admitting: Student

## 2023-12-07 ENCOUNTER — Other Ambulatory Visit: Payer: Self-pay | Admitting: Student

## 2024-04-07 ENCOUNTER — Other Ambulatory Visit: Payer: Self-pay | Admitting: Student

## 2024-04-10 ENCOUNTER — Other Ambulatory Visit: Payer: Self-pay | Admitting: Student

## 2024-04-13 ENCOUNTER — Other Ambulatory Visit: Payer: Self-pay | Admitting: Student
